# Patient Record
Sex: Male | Born: 1985 | Race: White | Hispanic: No | Marital: Married | Smoking: Current every day smoker
Health system: Southern US, Community
[De-identification: ages and names within clinical notes are randomized; demographics above are authoritative.]

## PROBLEM LIST (undated history)

## (undated) DIAGNOSIS — F0781 Postconcussional syndrome: Principal | ICD-10-CM

## (undated) DIAGNOSIS — G43009 Migraine without aura, not intractable, without status migrainosus: Secondary | ICD-10-CM

## (undated) DIAGNOSIS — R002 Palpitations: Secondary | ICD-10-CM

## (undated) DIAGNOSIS — R42 Dizziness and giddiness: Secondary | ICD-10-CM

## (undated) DIAGNOSIS — I10 Essential (primary) hypertension: Secondary | ICD-10-CM

## (undated) HISTORY — DX: Postconcussional syndrome: F07.81

## (undated) HISTORY — DX: Dizziness and giddiness: R42

## (undated) HISTORY — PX: HERNIA REPAIR: SHX51

## (undated) HISTORY — PX: ORTHOPEDIC SURGERY: SHX850

## (undated) HISTORY — PX: BACK SURGERY: SHX140

## (undated) HISTORY — DX: Migraine without aura, not intractable, without status migrainosus: G43.009

## (undated) HISTORY — DX: Palpitations: R00.2

---
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
## 2005-01-17 ENCOUNTER — Emergency Department (HOSPITAL_COMMUNITY): Admission: EM | Admit: 2005-01-17 | Discharge: 2005-01-17 | Payer: Self-pay | Admitting: Emergency Medicine

## 2006-01-28 ENCOUNTER — Emergency Department (HOSPITAL_COMMUNITY): Admission: EM | Admit: 2006-01-28 | Discharge: 2006-01-28 | Payer: Self-pay | Admitting: Emergency Medicine

## 2006-09-03 ENCOUNTER — Emergency Department (HOSPITAL_COMMUNITY): Admission: EM | Admit: 2006-09-03 | Discharge: 2006-09-03 | Payer: Self-pay | Admitting: Emergency Medicine

## 2006-09-06 ENCOUNTER — Ambulatory Visit (HOSPITAL_COMMUNITY): Admission: RE | Admit: 2006-09-06 | Discharge: 2006-09-06 | Payer: Self-pay | Admitting: Family Medicine

## 2006-10-26 ENCOUNTER — Ambulatory Visit (HOSPITAL_COMMUNITY): Admission: RE | Admit: 2006-10-26 | Discharge: 2006-10-27 | Payer: Self-pay | Admitting: Neurosurgery

## 2006-11-23 ENCOUNTER — Ambulatory Visit (HOSPITAL_COMMUNITY): Admission: RE | Admit: 2006-11-23 | Discharge: 2006-11-23 | Payer: Self-pay | Admitting: Family Medicine

## 2007-02-16 ENCOUNTER — Ambulatory Visit (HOSPITAL_COMMUNITY): Admission: RE | Admit: 2007-02-16 | Discharge: 2007-02-16 | Payer: Self-pay | Admitting: Neurosurgery

## 2007-03-26 ENCOUNTER — Encounter (HOSPITAL_COMMUNITY): Admission: RE | Admit: 2007-03-26 | Discharge: 2007-04-25 | Payer: Self-pay | Admitting: Neurosurgery

## 2007-04-26 ENCOUNTER — Encounter (HOSPITAL_COMMUNITY): Admission: RE | Admit: 2007-04-26 | Discharge: 2007-05-26 | Payer: Self-pay | Admitting: Neurosurgery

## 2007-09-17 ENCOUNTER — Encounter
Admission: RE | Admit: 2007-09-17 | Discharge: 2007-09-18 | Payer: Self-pay | Admitting: Physical Medicine & Rehabilitation

## 2007-09-17 ENCOUNTER — Ambulatory Visit: Payer: Self-pay | Admitting: Physical Medicine & Rehabilitation

## 2007-11-21 ENCOUNTER — Ambulatory Visit: Payer: Self-pay | Admitting: Physical Medicine & Rehabilitation

## 2007-11-29 ENCOUNTER — Emergency Department (HOSPITAL_COMMUNITY): Admission: EM | Admit: 2007-11-29 | Discharge: 2007-11-29 | Payer: Self-pay | Admitting: Emergency Medicine

## 2008-10-13 ENCOUNTER — Emergency Department (HOSPITAL_COMMUNITY): Admission: EM | Admit: 2008-10-13 | Discharge: 2008-10-14 | Payer: Self-pay | Admitting: Emergency Medicine

## 2009-03-11 ENCOUNTER — Emergency Department (HOSPITAL_COMMUNITY): Admission: EM | Admit: 2009-03-11 | Discharge: 2009-03-11 | Payer: Self-pay | Admitting: Emergency Medicine

## 2010-04-16 ENCOUNTER — Ambulatory Visit (HOSPITAL_COMMUNITY): Admission: RE | Admit: 2010-04-16 | Discharge: 2010-04-16 | Payer: Self-pay | Admitting: General Surgery

## 2010-11-28 ENCOUNTER — Encounter: Payer: Self-pay | Admitting: Neurosurgery

## 2011-01-24 LAB — BASIC METABOLIC PANEL
CO2: 27 mEq/L (ref 19–32)
Calcium: 9.6 mg/dL (ref 8.4–10.5)
GFR calc Af Amer: >60 mL/min (ref >60)
GFR calc non Af Amer: >60 mL/min (ref >60)
Sodium: 139 mEq/L (ref 135–145)

## 2011-01-24 LAB — SURGICAL PCR SCREEN: MRSA, PCR: NEGATIVE

## 2011-01-24 LAB — CBC
Hemoglobin: 13.7 g/dL (ref 13.0–17.0)
RBC: 4.3 MIL/uL (ref 4.22–5.81)
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
## 2011-03-25 NOTE — Op Note (Signed)
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   PROCEDURE:  Bilateral L4-5 microdiskectomy using microdissection.   SURGEON:  Cristi Loron, M.D.   ASSISTANT:  Danae Orleans. Venetia Maxon, M.D.   ANESTHESIA:  General endotracheal.   ESTIMATED BLOOD LOSS:  50 cc.   SPECIMENS:  None.   DRAINS:  None.   COMPLICATIONS:  None.   BRIEF HISTORY:  The patient is a 25 year old white male, who suffers  from back and bilateral leg pain.  He has failed medical management and  was worked up with a lumbar MRI, which demonstrated a large herniated  disk at L4-5 bilaterally.  I discussed the various treatment options  with the patient, including surgery.  The patient weighed the risks,  benefits and alternatives of surgery and decided to proceed with an L4-5  microdiskectomy.   DESCRIPTION OF PROCEDURE:  The patient was brought to the operating room  by the anesthesia team.  General endotracheal anesthesia was induced.  The patient was then turned to the prone position on the Wilson frame.  His lumbosacral region was then prepared with Benadryl Scrub with  Betadine Solution and sterile drapes were applied.  I then injected the  area to be incised with Marcaine with epinephrine solution and used a  scalpel to make a linear midline incision over the L4-5 interspace.  I  used electrocautery to perform a  bilateral subperiosteal dissection,  exposing the bilateral spinous processes and laminae of L4 and L5.  We  then obtained intraoperative radiograph to confirm our location.  We  then inserted a McCullough retractor for exposure, and then we brought  the operating microscope into the field, and under its magnification and  illumination, we completed the microdissection/decompression.  I used a  high-speed drill to perform bilateral L4 laminotomies.  We then widened  the laminotomies with a Kerrison punch and removed the bilateral L4-5  ligamentum flavum.  We then used microdissection to free up the thecal  sac and the nerve roots from the epidural tissue.  We encountered, as  suspected, a large herniated disk at L4-5.  We began the decompression  on the right side.  We incised the L4-5 intervertebral disk/disk  herniation on the right.  We performed a partial intervertebral  diskectomy using a pituitary forceps and Epstein curets.  We then  repeated this procedure on the left side, and at this point we had a  good bilateral decompression.  We palpated along the ventral surface of  the thecal sac and along the external route of the bilateral L5 nerve  roots,  and noted that the neural structures were well decompressed.  We  obtained hemostasis using bipolar electrocautery.  We irrigated the  wound out with Bacitracin solution.  We removed the McCullough retractor  and then reapproximated the patient's thoracolumbar fascia with  interrupted #1 Vicryl suture, the subcutaneous tissue with interrupted 2-  0 Vicryl suture, and the skin with Steri-Strips and benzoin.  The wound  was then coated with Bacitracin ointment and a sterile dressing was  applied.  The drapes were removed and the patient was subsequently  returned to the supine position, where he was extubated by the  anesthesia team and transported to the postanesthesia care unit in  stable condition.  All sponge, instrument and needle  counts were correct  at the end of this case.      Cristi Loron, M.D.  Electronically Signed     JDJ/MEDQ  D:  10/26/2006  T:  10/27/2006  Job:  [id number]

## 2011-06-09 ENCOUNTER — Emergency Department (HOSPITAL_COMMUNITY)
Admission: EM | Admit: 2011-06-09 | Discharge: 2011-06-09 | Disposition: A | Discharge status: Home / Self Care | Payer: Self-pay | Attending: Emergency Medicine | Admitting: Emergency Medicine

## 2011-06-09 DIAGNOSIS — M549 Dorsalgia, unspecified: Secondary | ICD-10-CM | POA: Insufficient documentation

## 2011-06-09 DIAGNOSIS — I10 Essential (primary) hypertension: Secondary | ICD-10-CM | POA: Insufficient documentation

## 2011-06-09 DIAGNOSIS — R209 Unspecified disturbances of skin sensation: Secondary | ICD-10-CM | POA: Insufficient documentation

## 2011-06-09 DIAGNOSIS — G8929 Other chronic pain: Secondary | ICD-10-CM | POA: Insufficient documentation

## 2011-06-09 DIAGNOSIS — Z760 Encounter for issue of repeat prescription: Secondary | ICD-10-CM | POA: Insufficient documentation

## 2011-06-09 MED ORDER — CLONIDINE HCL 0.2 MG PO TABS: 0.1000 mg | ORAL_TABLET | Freq: Two times a day (BID) | ORAL | Status: DC

## 2011-06-09 MED ORDER — CLONIDINE HCL 0.1 MG PO TABS
0.1000 mg | ORAL_TABLET | Freq: Once | ORAL | Status: AC
  Administered 2011-06-09: 0.1 mg via ORAL
  Filled 2011-06-09: qty 1

## 2011-06-09 MED ORDER — HYDROCODONE-ACETAMINOPHEN 5-325 MG PO TABS: 1.0000 | ORAL_TABLET | ORAL | Status: AC | PRN

## 2011-06-09 MED ORDER — GABAPENTIN 300 MG PO CAPS: 300.0000 mg | ORAL_CAPSULE | Freq: Three times a day (TID) | ORAL | Status: DC
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
## 2011-06-11 NOTE — ED Provider Notes (Signed)
Medical screening examination/treatment/procedure(s) were performed by non-physician practitioner and as supervising physician I was immediately available for consultation/collaboration.  Joya Gaskins, MD 06/11/11 [phone]

## 2011-07-08 ENCOUNTER — Emergency Department (HOSPITAL_COMMUNITY): Payer: Worker's Compensation

## 2011-07-08 ENCOUNTER — Emergency Department (HOSPITAL_COMMUNITY)
Admission: EM | Admit: 2011-07-08 | Discharge: 2011-07-08 | Disposition: A | Discharge status: Home / Self Care | Payer: Worker's Compensation | Attending: Emergency Medicine | Admitting: Emergency Medicine

## 2011-07-08 ENCOUNTER — Encounter: Payer: Self-pay | Admitting: *Deleted

## 2011-07-08 DIAGNOSIS — F17200 Nicotine dependence, unspecified, uncomplicated: Secondary | ICD-10-CM | POA: Insufficient documentation

## 2011-07-08 DIAGNOSIS — I10 Essential (primary) hypertension: Secondary | ICD-10-CM | POA: Insufficient documentation

## 2011-07-08 DIAGNOSIS — W269XXA Contact with unspecified sharp object(s), initial encounter: Secondary | ICD-10-CM | POA: Insufficient documentation

## 2011-07-08 DIAGNOSIS — S61209A Unspecified open wound of unspecified finger without damage to nail, initial encounter: Secondary | ICD-10-CM | POA: Insufficient documentation

## 2011-07-08 DIAGNOSIS — S61219A Laceration without foreign body of unspecified finger without damage to nail, initial encounter: Secondary | ICD-10-CM

## 2011-07-08 DIAGNOSIS — Y9269 Other specified industrial and construction area as the place of occurrence of the external cause: Secondary | ICD-10-CM | POA: Insufficient documentation

## 2011-07-08 HISTORY — DX: Essential (primary) hypertension: I10

## 2011-07-08 MED ORDER — CEPHALEXIN 500 MG PO CAPS
500.0000 mg | ORAL_CAPSULE | Freq: Once | ORAL | Status: AC
  Administered 2011-07-08: 500 mg via ORAL
  Filled 2011-07-08: qty 1

## 2011-07-08 MED ORDER — CEPHALEXIN 500 MG PO CAPS: 500.0000 mg | ORAL_CAPSULE | Freq: Four times a day (QID) | ORAL | Status: AC

## 2011-07-08 MED ORDER — LIDOCAINE HCL (PF) 1 % IJ SOLN
INTRAMUSCULAR | Status: AC
  Administered 2011-07-08: 22:00:00
  Filled 2011-07-08: qty 5

## 2011-07-08 NOTE — ED Provider Notes (Signed)
History     CSN: [id number] Arrival date & time: 07/08/2011  8:04 PM  Chief Complaint  Patient presents with  . Extremity Laceration   HPI  Past Medical History  Diagnosis Date  . Hypertension     Past Surgical History  Procedure Date  . Back surgery   . Hernia repair   . Orthopedic surgery     History reviewed. No pertinent family history.  History  Substance Use Topics  . Smoking status: Current Everyday Smoker -- 0.5 packs/day  . Smokeless tobacco: Not on file  . Alcohol Use: No      Review of Systems  Physical Exam  BP 116/76  Pulse 91  Temp 99 F (37.2 C)  Resp 20  SpO2 100%  Physical Exam  Musculoskeletal:       Left hand: He exhibits tenderness, decreased capillary refill and laceration. He exhibits normal range of motion, no bony tenderness, normal two-point discrimination, no deformity and no swelling. normal sensation noted. Normal strength noted.       Hands:   ED Course  Procedures  MDM       Worthy Rancher, [place] 07/08/11 2201

## 2011-07-08 NOTE — ED Notes (Signed)
Lac to right middle finger

## 2011-07-08 NOTE — ED Notes (Signed)
laceartion to left middle finger, cut at work

## 2011-07-09 NOTE — ED Provider Notes (Signed)
Medical screening examination/treatment/procedure(s) were performed by non-physician practitioner and as supervising physician I was immediately available for consultation/collaboration.  Glynn Octave, MD 07/09/11 [phone]

## 2012-08-02 ENCOUNTER — Emergency Department (HOSPITAL_COMMUNITY)
Admission: EM | Admit: 2012-08-02 | Discharge: 2012-08-03 | Disposition: A | Discharge status: Home / Self Care | Payer: Worker's Compensation | Attending: Emergency Medicine | Admitting: Emergency Medicine

## 2012-08-02 ENCOUNTER — Emergency Department (HOSPITAL_COMMUNITY): Payer: No Typology Code available for payment source

## 2012-08-02 ENCOUNTER — Encounter (HOSPITAL_COMMUNITY): Payer: Self-pay

## 2012-08-02 DIAGNOSIS — F17200 Nicotine dependence, unspecified, uncomplicated: Secondary | ICD-10-CM | POA: Insufficient documentation

## 2012-08-02 DIAGNOSIS — S39012A Strain of muscle, fascia and tendon of lower back, initial encounter: Secondary | ICD-10-CM

## 2012-08-02 DIAGNOSIS — S335XXA Sprain of ligaments of lumbar spine, initial encounter: Secondary | ICD-10-CM | POA: Insufficient documentation

## 2012-08-02 DIAGNOSIS — S139XXA Sprain of joints and ligaments of unspecified parts of neck, initial encounter: Secondary | ICD-10-CM | POA: Insufficient documentation

## 2012-08-02 DIAGNOSIS — I10 Essential (primary) hypertension: Secondary | ICD-10-CM | POA: Insufficient documentation

## 2012-08-02 DIAGNOSIS — S161XXA Strain of muscle, fascia and tendon at neck level, initial encounter: Secondary | ICD-10-CM
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
## 2012-08-03 MED ORDER — METHOCARBAMOL 500 MG PO TABS
1000.0000 mg | ORAL_TABLET | Freq: Once | ORAL | Status: AC
  Administered 2012-08-03: 1000 mg via ORAL
  Filled 2012-08-03: qty 2

## 2012-08-03 MED ORDER — METHOCARBAMOL 500 MG PO TABS: ORAL_TABLET | ORAL | Status: DC

## 2012-08-03 MED ORDER — HYDROCODONE-ACETAMINOPHEN 5-325 MG PO TABS
2.0000 | ORAL_TABLET | Freq: Once | ORAL | Status: AC
  Administered 2012-08-03: 2 via ORAL
  Filled 2012-08-03: qty 2

## 2012-08-03 MED ORDER — HYDROCODONE-ACETAMINOPHEN 5-325 MG PO TABS: 1.0000 | ORAL_TABLET | ORAL | Status: DC | PRN

## 2012-08-03 NOTE — ED Notes (Signed)
Pt alert & oriented x4, stable gait. Patient given discharge instructions, paperwork & prescription(s). Patient  instructed to stop at the registration desk to finish any additional paperwork. Patient verbalized understanding. Pt left department w/ no further questions. 

## 2012-08-07 NOTE — ED Provider Notes (Signed)
Medical screening examination/treatment/procedure(s) were performed by non-physician practitioner and as supervising physician I was immediately available for consultation/collaboration.   Joya Gaskins, MD 08/07/12 [phone]

## 2015-09-03 ENCOUNTER — Encounter: Payer: Self-pay | Admitting: Neurology

## 2015-09-03 ENCOUNTER — Ambulatory Visit (INDEPENDENT_AMBULATORY_CARE_PROVIDER_SITE_OTHER): Payer: Self-pay | Admitting: Neurology

## 2015-09-03 VITALS — BP 132/83 | HR 85 | Ht 74.0 in | Wt 199.0 lb

## 2015-09-03 DIAGNOSIS — G43009 Migraine without aura, not intractable, without status migrainosus: Secondary | ICD-10-CM

## 2015-09-03 DIAGNOSIS — R42 Dizziness and giddiness: Secondary | ICD-10-CM | POA: Insufficient documentation

## 2015-09-03 DIAGNOSIS — F0781 Postconcussional syndrome: Secondary | ICD-10-CM

## 2015-09-03 HISTORY — DX: Migraine without aura, not intractable, without status migrainosus: G43.009

## 2015-09-03 HISTORY — DX: Postconcussional syndrome: F07.81

## 2015-09-03 HISTORY — DX: Dizziness and giddiness: R42

## 2015-09-03 MED ORDER — ONDANSETRON 4 MG PO TBDP: 4.0000 mg | ORAL_TABLET | Freq: Four times a day (QID) | ORAL | Status: AC | PRN

## 2015-09-03 MED ORDER — MECLIZINE HCL 25 MG PO TABS: 25.0000 mg | ORAL_TABLET | Freq: Three times a day (TID) | ORAL | Status: AC | PRN

## 2015-09-03 MED ORDER — TOPIRAMATE 25 MG PO TABS: ORAL_TABLET | ORAL | Status: DC
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
## 2015-09-03 NOTE — Patient Instructions (Addendum)
We will start Topamax for the headache. No physical or cognitive activity until seen next.  Topamax (topiramate) is a seizure medication that has an FDA approval for seizures and for migraine headache. Potential side effects of this medication include weight loss, cognitive slowing, tingling in the fingers and toes, and carbonated drinks will taste bad. If any significant side effects are noted on this drug, please contact our office.    Post-Concussion Syndrome Post-concussion syndrome describes the symptoms that can occur after a head injury. These symptoms can last from weeks to months. CAUSES  It is not clear why some head injuries cause post-concussion syndrome. It can occur whether your head injury was mild or severe and whether you were wearing head protection or not.  SIGNS AND SYMPTOMS  Memory difficulties.  Dizziness.  Headaches.  Double vision or blurry vision.  Sensitivity to light.  Hearing difficulties.  Depression.  Tiredness.  Weakness.  Difficulty with concentration.  Difficulty sleeping or staying asleep.  Vomiting.  Poor balance or instability on your feet.  Slow reaction time.  Difficulty learning and remembering things you have heard. DIAGNOSIS  There is no test to determine whether you have post-concussion syndrome. Your health care provider may order an imaging scan of your brain, such as a CT scan, to check for other problems that may be causing your symptoms (such as a severe injury inside your skull). TREATMENT  Usually, these problems disappear over time without medical care. Your health care provider may prescribe medicine to help ease your symptoms. It is important to follow up with a neurologist to evaluate your recovery and address any lingering symptoms or issues. HOME CARE INSTRUCTIONS   Take medicines only as directed by your health care provider. Do not take aspirin. Aspirin can slow blood clotting.  Sleep with your head slightly  elevated to help with headaches.  Avoid any situation where there is potential for another head injury. This includes football, hockey, soccer, basketball, martial arts, downhill snow sports, and horseback riding. Your condition will get worse every time you experience a concussion. You should avoid these activities until you are evaluated by the appropriate follow-up health care providers.  Keep all follow-up visits as directed by your health care provider. This is important. SEEK MEDICAL CARE IF:  You have increased problems paying attention or concentrating.  You have increased difficulty remembering or learning new information.  You need more time to complete tasks or assignments than before.  You have increased irritability or decreased ability to cope with stress.  You have more symptoms than before. Seek medical care if you have any of the following symptoms for more than two weeks after your injury:  Lasting (chronic) headaches.  Dizziness or balance problems.  Nausea.  Vision problems.  Increased sensitivity to noise or light.  Depression or mood swings.  Anxiety or irritability.  Memory problems.  Difficulty concentrating or paying attention.  Sleep problems.  Feeling tired all the time. SEEK IMMEDIATE MEDICAL CARE IF:  You have confusion or unusual drowsiness.  Others find it difficult to wake you up.  You have nausea or persistent, forceful vomiting.  You feel like you are moving when you are not (vertigo). Your eyes may move rapidly back and forth.  You have convulsions or faint.  You have severe, persistent headaches that are not relieved by medicine.  You cannot use your arms or legs normally.  One of your pupils is larger than the other.  You have clear or  bloody discharge from your nose or ears.  Your problems are getting worse, not better. MAKE SURE YOU:  Understand these instructions.  Will watch your condition.  Will get help right  away if you are not doing well or get worse.   This information is not intended to replace advice given to you by your health care provider. Make sure you discuss any questions you have with your health care provider.   Document Released: 04/15/2002 Document Revised: 11/14/2014 Document Reviewed: 01/29/2014 Elsevier Interactive Patient Education [employer]2016 Elsevier Inc.

## 2015-10-05 ENCOUNTER — Ambulatory Visit: Payer: Self-pay | Admitting: Adult Health

## 2015-10-08 ENCOUNTER — Telehealth: Payer: Self-pay | Admitting: Neurology

## 2015-10-08 NOTE — Telephone Encounter (Signed)
Darcus AustinJill Wilkes RN [facility] [place] [facility] called sts pt will be seeing a neurologist that accepts W/C. She appreciated Dr Anne HahnWillis seeing pt and getting medical care started.

## 2016-01-18 ENCOUNTER — Other Ambulatory Visit: Payer: Self-pay | Admitting: Neurology

## 2016-07-06 ENCOUNTER — Ambulatory Visit: Payer: Self-pay | Admitting: Family Medicine

## 2017-09-08 DIAGNOSIS — Z6831 Body mass index (BMI) 31.0-31.9, adult: Secondary | ICD-10-CM | POA: Diagnosis not present

## 2017-09-08 DIAGNOSIS — M542 Cervicalgia: Secondary | ICD-10-CM | POA: Diagnosis not present

## 2017-09-08 DIAGNOSIS — R51 Headache: Secondary | ICD-10-CM | POA: Diagnosis not present

## 2017-09-08 DIAGNOSIS — R202 Paresthesia of skin: Secondary | ICD-10-CM | POA: Diagnosis not present

## 2017-09-18 DIAGNOSIS — Z0000 Encounter for general adult medical examination without abnormal findings: Secondary | ICD-10-CM | POA: Diagnosis not present

## 2017-09-20 DIAGNOSIS — Z0000 Encounter for general adult medical examination without abnormal findings: Secondary | ICD-10-CM | POA: Diagnosis not present

## 2017-09-26 ENCOUNTER — Other Ambulatory Visit (HOSPITAL_COMMUNITY): Payer: Self-pay | Admitting: Internal Medicine

## 2017-09-26 DIAGNOSIS — M542 Cervicalgia: Secondary | ICD-10-CM

## 2017-09-26 DIAGNOSIS — G44221 Chronic tension-type headache, intractable: Secondary | ICD-10-CM

## 2017-09-26 DIAGNOSIS — R202 Paresthesia of skin: Secondary | ICD-10-CM

## 2017-10-03 ENCOUNTER — Ambulatory Visit (HOSPITAL_COMMUNITY)
Admission: RE | Admit: 2017-10-03 | Discharge: 2017-10-03 | Disposition: A | Discharge status: Home / Self Care | Payer: Worker's Compensation | Source: Ambulatory Visit | Attending: Internal Medicine | Admitting: Internal Medicine

## 2017-10-03 ENCOUNTER — Ambulatory Visit (HOSPITAL_COMMUNITY)
Admission: RE | Admit: 2017-10-03 | Discharge: 2017-10-03 | Disposition: A | Discharge status: Home / Self Care | Payer: Self-pay | Source: Ambulatory Visit | Attending: Internal Medicine | Admitting: Internal Medicine

## 2017-10-03 DIAGNOSIS — R202 Paresthesia of skin: Secondary | ICD-10-CM

## 2017-10-03 DIAGNOSIS — M542 Cervicalgia: Secondary | ICD-10-CM

## 2017-10-03 DIAGNOSIS — G44221 Chronic tension-type headache, intractable: Secondary | ICD-10-CM

## 2017-10-03 MED ORDER — GADOBENATE DIMEGLUMINE 529 MG/ML IV SOLN
20.0000 mL | Freq: Once | INTRAVENOUS | Status: AC | PRN
  Administered 2017-10-03: 20 mL via INTRAVENOUS

## 2018-03-27 DIAGNOSIS — F1120 Opioid dependence, uncomplicated: Secondary | ICD-10-CM | POA: Diagnosis not present

## 2018-03-27 DIAGNOSIS — Z7151 Drug abuse counseling and surveillance of drug abuser: Secondary | ICD-10-CM | POA: Diagnosis not present

## 2021-02-05 ENCOUNTER — Other Ambulatory Visit: Payer: Self-pay

## 2021-02-05 NOTE — Patient Outreach (Signed)
Care Coordination  02/05/2021  EBERARDO DEMELLO Jan 18, 1986 [id number]   Medicaid Managed Care   Unsuccessful Outreach Note  02/05/2021 Name: APOLLOS TENBRINK MRN: [id number] DOB: [birth year]  Referred by: Babs Sciara, MD Reason for referral : High Risk Managed Medicaid (MM Screen Unsuccessful Telephone Outreach)   An unsuccessful telephone outreach was attempted today. The patient was referred to the case management team for assistance with care management and care coordination.   Follow Up Plan: The care management team will reach out to the patient again over the next 7 days.   Gus Puma, BSW, [place] [facility]  [place]  High Risk Managed Medicaid Team

## 2021-02-05 NOTE — Patient Instructions (Signed)
Visit Information  Mr. Geoffrey Carson  - as a part of your Medicaid benefit, you are eligible for care management and care coordination services at no cost or copay. I was unable to reach you by phone today but would be happy to help you with your health related needs. Please feel free to call me @ [phone].   A member of the Managed Medicaid care management team will reach out to you again over the next 7 days.   Raesha Coonrod, BSW, [facility]    High Risk Managed Medicaid Team    

## 2021-02-16 ENCOUNTER — Telehealth: Payer: Self-pay

## 2021-02-16 NOTE — Patient Outreach (Signed)
Care Coordination  02/16/2021  Geoffrey Carson Sep 03, 1986 [id number]   Medicaid Managed Care   Unsuccessful Outreach Note  02/16/2021 Name: Geoffrey Carson MRN: [id number] DOB: 1985/12/13  Referred by: Babs Sciara, MD Reason for referral : High Risk Managed Medicaid (MM Screen Unsuccessful Telephone Outreach)   A second unsuccessful telephone outreach was attempted today. The patient was referred to the case management team for assistance with care management and care coordination.   Follow Up Plan: The care management team will reach out to the patient again over the next 7 days.   Gus Puma, BSW, [place] [facility]  [place]  High Risk Managed Medicaid Team

## 2021-02-16 NOTE — Patient Instructions (Signed)
Visit Information  Mr. Geoffrey Carson  - as a part of your Medicaid benefit, you are eligible for care management and care coordination services at no cost or copay. I was unable to reach you by phone today but would be happy to help you with your health related needs. Please feel free to call me @ [phone].   A member of the Managed Medicaid care management team will reach out to you again over the next 7 days.   Gus Puma, BSW, [place] [facility]  [place]  High Risk Managed Medicaid Team

## 2021-09-04 ENCOUNTER — Emergency Department (HOSPITAL_COMMUNITY)
Admission: EM | Admit: 2021-09-04 | Discharge: 2021-09-04 | Disposition: A | Discharge status: Home / Self Care | Payer: Medicaid Other | Attending: Emergency Medicine | Admitting: Emergency Medicine

## 2021-09-04 ENCOUNTER — Encounter: Payer: Self-pay | Admitting: Emergency Medicine

## 2021-09-04 ENCOUNTER — Encounter (HOSPITAL_COMMUNITY): Payer: Self-pay | Admitting: *Deleted

## 2021-09-04 ENCOUNTER — Other Ambulatory Visit: Payer: Self-pay

## 2021-09-04 ENCOUNTER — Ambulatory Visit
Admission: EM | Admit: 2021-09-04 | Discharge: 2021-09-04 | Disposition: A | Discharge status: Home / Self Care | Payer: Medicaid Other

## 2021-09-04 DIAGNOSIS — Z87891 Personal history of nicotine dependence: Secondary | ICD-10-CM | POA: Insufficient documentation

## 2021-09-04 DIAGNOSIS — Z760 Encounter for issue of repeat prescription: Secondary | ICD-10-CM | POA: Diagnosis not present

## 2021-09-04 DIAGNOSIS — I10 Essential (primary) hypertension: Secondary | ICD-10-CM | POA: Insufficient documentation

## 2021-09-04 DIAGNOSIS — R531 Weakness: Secondary | ICD-10-CM | POA: Insufficient documentation

## 2021-09-04 MED ORDER — BUPRENORPHINE HCL-NALOXONE HCL 8-2 MG SL FILM: 1.0000 | ORAL_FILM | Freq: Three times a day (TID) | SUBLINGUAL | 0 refills | Status: DC

## 2021-09-04 MED ORDER — CLONAZEPAM 0.5 MG PO TABS: ORAL_TABLET | ORAL | 0 refills | Status: DC

## 2021-09-04 MED ORDER — TRAMADOL HCL 50 MG PO TABS: 100.0000 mg | ORAL_TABLET | Freq: Four times a day (QID) | ORAL | 0 refills | Status: DC | PRN

## 2021-09-04 NOTE — ED Provider Notes (Signed)
[facility] EMERGENCY DEPARTMENT Provider Note   CSN: [id number] Arrival date & time: 09/04/21  1321     History Chief Complaint  Patient presents with   Medication Refill    Geoffrey Carson is a 35 y.o. male.  Patient with a history of chronic pain.  He has been on Ultram naloxone and clonazepam.  He is unable to get in touch with his doctor to get a refill.  He has been off all his medicines for 3 days  The history is provided by the patient and medical records. No language interpreter was used.  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/03/2015    Patient Active Problem List   Diagnosis Date Noted   Post concussion syndrome 09/03/2015   Post-concussion vertigo 09/03/2015   Common migraine 09/03/2015    Past Surgical History:  Procedure Laterality Date   BACK SURGERY     HERNIA REPAIR     ORTHOPEDIC SURGERY         Family History  Problem Relation Age of Onset   Arthritis Father    Hypertension Father    Migraines Father    Diabetes Father    Diabetes Maternal Grandmother    Diabetes Maternal Grandfather    Diabetes Paternal Grandmother    Diabetes Paternal Grandfather    Cancer Mother        breast   Diabetes Mother    Celiac disease Sister     Social History   Tobacco Use   Smoking status: Former    Packs/day: 0.50    Types: Cigarettes    Quit date: 08/07/2014    Years since quitting: 7.0   Smokeless tobacco: Never  Substance Use Topics   Alcohol use: No   Drug use: No    Home Medications Prior to  Admission medications   Medication Sig Start Date End Date Taking? Authorizing Provider  Buprenorphine HCl-Naloxone HCl 8-2 MG FILM Place 1 Film (1 each total) inside cheek 3 (three) times daily. One tid 09/04/21  Yes Bethann Berkshire, MD  clonazePAM Scarlette Calico) 0.5 MG tablet One tablet qid 09/04/21  Yes Bethann Berkshire, MD  traMADol (ULTRAM) 50 MG tablet Take 2 tablets (100 mg total) by mouth every 6 (six) hours as needed for moderate pain. 09/04/21  Yes Bethann Berkshire, MD  ibuprofen (ADVIL,MOTRIN) 200 MG tablet Take 800 mg by mouth 2 (two) times daily as needed. For pain     [provider]  meclizine (ANTIVERT) 25 MG tablet Take 1 tablet (25 mg total) by mouth 3 (three) times daily as needed for dizziness. 09/03/15   York Spaniel, MD  ondansetron (ZOFRAN-ODT) 4 MG disintegrating tablet Take 1 tablet (4 mg total) by mouth every 6 (six) hours as needed for nausea or vomiting. 09/03/15   York Spaniel, MD  sertraline (ZOLOFT) 50 MG tablet Take 50 mg by mouth daily.    [provider]  topiramate (TOPAMAX) 25 MG tablet Take one tablet at night for one week, then take 2 tablets at night for one week, then take 3  tablets at night. 09/03/15   York Spaniel, MD    Allergies    Azithromycin, Erythromycin, and Escitalopram  Review of Systems   Review of Systems  Constitutional:  Negative for appetite change and fatigue.  HENT:  Negative for congestion, ear discharge and sinus pressure.   Eyes:  Negative for discharge.  Respiratory:  Negative for cough.   Cardiovascular:  Negative for chest pain.  Gastrointestinal:  Negative for abdominal pain and diarrhea.  Genitourinary:  Negative for frequency and hematuria.  Musculoskeletal:  Negative for back pain.  Skin:  Negative for rash.  Neurological:  Positive for weakness. Negative for seizures and headaches.  Psychiatric/Behavioral:  Negative for hallucinations.    Physical Exam Updated Vital Signs BP (!) 128/91 (BP  Location: Right Arm)   Pulse 82   Temp 97.9 F (36.6 C) (Oral)   Resp 17   SpO2 100%   Physical Exam Vitals and nursing note reviewed.  Constitutional:      Appearance: He is well-developed.  HENT:     Head: Normocephalic.     Nose: Nose normal.  Eyes:     General: No scleral icterus.    Conjunctiva/sclera: Conjunctivae normal.  Neck:     Thyroid: No thyromegaly.  Cardiovascular:     Rate and Rhythm: Normal rate and regular rhythm.     Heart sounds: No murmur heard.   No friction rub. No gallop.  Pulmonary:     Breath sounds: No stridor. No wheezing or rales.  Chest:     Chest wall: No tenderness.  Abdominal:     General: There is no distension.     Tenderness: There is no abdominal tenderness. There is no rebound.  Musculoskeletal:        General: Normal range of motion.     Cervical back: Neck supple.  Lymphadenopathy:     Cervical: No cervical adenopathy.  Skin:    Findings: No erythema or rash.  Neurological:     Mental Status: He is alert and oriented to person, place, and time.     Motor: No abnormal muscle tone.     Coordination: Coordination normal.  Psychiatric:        Behavior: Behavior normal.    ED Results / Procedures / Treatments   Labs (all labs ordered are listed, but only abnormal results are displayed) Labs Reviewed - No data to display  EKG None  Radiology No results found.  Procedures Procedures   Medications Ordered in ED Medications - No data to display  ED Course  I have reviewed the triage vital signs and the nursing notes.  Pertinent labs & imaging results that were available during my care of the patient were reviewed by me and considered in my medical decision making (see chart for details).    MDM Rules/Calculators/A&P                           Patient for medication refill of his tramadol clonazepam and Suboxone.  He was given 5-day supply and will follow up with his primary care or his pain management doctor Final  Clinical Impression(s) / ED Diagnoses Final diagnoses:  Medication refill    Rx / DC Orders ED Discharge Orders          Ordered    traMADol (ULTRAM) 50 MG tablet  Every 6 hours PRN        09/04/21 1529    clonazePAM (KLONOPIN) 0.5 MG  tablet        09/04/21 1529    Buprenorphine HCl-Naloxone HCl 8-2 MG FILM  3 times daily       Note to Pharmacy: NADEAN:  pt needs to take one pill tid   09/04/21 1529             Bethann Berkshire, MD 09/04/21 1533

## 2021-09-04 NOTE — ED Triage Notes (Signed)
Referred from urgent care for medication refill

## 2021-09-04 NOTE — ED Provider Notes (Signed)
Patient evaluated through triage.  Unfortunately I am not able to refill his medications.  Recommended that he present through the emergency room.  No visit was done.   Wallis Bamberg, PA-C 09/04/21 1302

## 2021-09-04 NOTE — ED Triage Notes (Signed)
Needs a medication refill on   clonazepam 0.5 mg 4 times a day, buprenorphin-naloxon 8mg  1 every 8 hours, tramadol 50 mg 2 tabs 4 times a day.  States he has not been able to get in touch with his doctor to get his  meds refilled.

## 2021-09-04 NOTE — Discharge Instructions (Signed)
Follow-up with your pain management doctor or Dr. Gerda Diss this week

## 2021-09-06 ENCOUNTER — Telehealth: Payer: Self-pay

## 2021-09-06 NOTE — Telephone Encounter (Signed)
Transition Care Management Follow-up Telephone Call Date of discharge and from where: 09/04/2021-[place]  How have you been since you were released from the hospital? Patient stated he is doing ok. Any questions or concerns? No  Items Reviewed: Did the pt receive and understand the discharge instructions provided? Yes  Medications obtained and verified? Yes  Other? No  Any new allergies since your discharge? No  Dietary orders reviewed? No Do you have support at home? Yes   Home Care and Equipment/Supplies: Were home health services ordered? not applicable If so, what is the name of the agency? N/A  Has the agency set up a time to come to the patient's home? not applicable Were any new equipment or medical supplies ordered?  No What is the name of the medical supply agency? N/A Were you able to get the supplies/equipment? not applicable Do you have any questions related to the use of the equipment or supplies? No  Functional Questionnaire: (I = Independent and D = Dependent) ADLs: I  Bathing/Dressing- I  Meal Prep- I  Eating- I  Maintaining continence- I  Transferring/Ambulation- I  Managing Meds- I  Follow up appointments reviewed:  PCP Hospital f/u appt confirmed? No  Specialist Hospital f/u appt confirmed? No   Are transportation arrangements needed? No  If their condition worsens, is the pt aware to call PCP or go to the Emergency Dept.? Yes Was the patient provided with contact information for the PCP's office or ED? Yes Was to pt encouraged to call back with questions or concerns? Yes

## 2021-12-10 ENCOUNTER — Encounter (HOSPITAL_COMMUNITY): Payer: Self-pay

## 2021-12-10 ENCOUNTER — Emergency Department (HOSPITAL_COMMUNITY)
Admission: EM | Admit: 2021-12-10 | Discharge: 2021-12-10 | Disposition: A | Discharge status: Home / Self Care | Payer: Medicaid Other | Attending: Emergency Medicine | Admitting: Emergency Medicine

## 2021-12-10 ENCOUNTER — Other Ambulatory Visit: Payer: Self-pay

## 2021-12-10 DIAGNOSIS — Z760 Encounter for issue of repeat prescription: Secondary | ICD-10-CM | POA: Insufficient documentation

## 2021-12-10 DIAGNOSIS — I10 Essential (primary) hypertension: Secondary | ICD-10-CM | POA: Insufficient documentation

## 2021-12-10 MED ORDER — BUPRENORPHINE HCL-NALOXONE HCL 8-2 MG SL FILM: 1.0000 | ORAL_FILM | Freq: Three times a day (TID) | SUBLINGUAL | 0 refills | Status: AC | PRN

## 2021-12-10 MED ORDER — CLONAZEPAM 0.5 MG PO TABS: 0.5000 mg | ORAL_TABLET | Freq: Four times a day (QID) | ORAL | 0 refills | Status: DC

## 2021-12-10 MED ORDER — TRAMADOL HCL 50 MG PO TABS: 50.0000 mg | ORAL_TABLET | Freq: Four times a day (QID) | ORAL | 0 refills | Status: AC | PRN
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
## 2021-12-10 NOTE — Discharge Instructions (Signed)
Medications have refilled, this medication can make you extremely sleepy as well as suppress your respiratory drive do not consume this with alcohol or operate heavy machinery  Please follow-up with a pain clinic and/or PCP for further evaluation  Come back to the emergency department if you develop chest pain, shortness of breath, severe abdominal pain, uncontrolled nausea, vomiting, diarrhea.

## 2021-12-10 NOTE — ED Triage Notes (Signed)
Medication refill  Suboxone 8mg  Clonazepam 0.5mg  Tramadol 50mg 

## 2021-12-13 ENCOUNTER — Telehealth: Payer: Self-pay

## 2021-12-13 NOTE — Telephone Encounter (Signed)
Transition Care Management Unsuccessful Follow-up Telephone Call  Date of discharge and from where:  12/10/2021 from [facility]  Attempts:  1st Attempt  Reason for unsuccessful TCM follow-up call:  Left voice message

## 2021-12-14 NOTE — Telephone Encounter (Signed)
Transition Care Management Unsuccessful Follow-up Telephone Call  Date of discharge and from where:  12/10/2021 from [facility]  Attempts:  2nd Attempt  Reason for unsuccessful TCM follow-up call:  Left voice message

## 2021-12-15 NOTE — Telephone Encounter (Signed)
Transition Care Management Follow-up Telephone Call Date of discharge and from where: 12/10/2021 from [facility] How have you been since you were released from the hospital? Patient stated that he is feeling good and did not have any questions. Patient went to [facility] for a medication refill and is having trouble est with a new primary care provider. Patient given information to est with [facility].  Any questions or concerns? No  Items Reviewed: Did the pt receive and understand the discharge instructions provided? Yes  Medications obtained and verified? Yes  Other? No  Any new allergies since your discharge? No  Dietary orders reviewed? No Do you have support at home? No   Functional Questionnaire: (I = Independent and D = Dependent) ADLs: I  Bathing/Dressing- I  Meal Prep- I  Eating- I  Maintaining continence- I  Transferring/Ambulation- I  Managing Meds- I   Follow up appointments reviewed:  PCP Hospital f/u appt confirmed? No   Specialist Hospital f/u appt confirmed? No   Are transportation arrangements needed? No  If their condition worsens, is the pt aware to call PCP or go to the Emergency Dept.? Yes Was the patient provided with contact information for the PCP's office or ED? Yes Was to pt encouraged to call back with questions or concerns? Yes

## 2021-12-17 ENCOUNTER — Emergency Department (HOSPITAL_COMMUNITY)
Admission: EM | Admit: 2021-12-17 | Discharge: 2021-12-17 | Disposition: A | Discharge status: Home / Self Care | Payer: Medicaid Other | Attending: Emergency Medicine | Admitting: Emergency Medicine

## 2021-12-17 ENCOUNTER — Encounter (HOSPITAL_COMMUNITY): Payer: Self-pay

## 2021-12-17 ENCOUNTER — Other Ambulatory Visit: Payer: Self-pay

## 2021-12-17 DIAGNOSIS — F419 Anxiety disorder, unspecified: Secondary | ICD-10-CM | POA: Insufficient documentation

## 2021-12-17 DIAGNOSIS — Z760 Encounter for issue of repeat prescription: Secondary | ICD-10-CM | POA: Insufficient documentation

## 2021-12-17 DIAGNOSIS — G8929 Other chronic pain: Secondary | ICD-10-CM | POA: Insufficient documentation

## 2021-12-17 MED ORDER — CLONAZEPAM 0.5 MG PO TABS: 0.5000 mg | ORAL_TABLET | Freq: Four times a day (QID) | ORAL | 0 refills | Status: DC

## 2021-12-17 MED ORDER — BUPRENORPHINE HCL 8 MG SL SUBL: 8.0000 mg | SUBLINGUAL_TABLET | Freq: Three times a day (TID) | SUBLINGUAL | 0 refills | Status: AC

## 2021-12-17 MED ORDER — TRAMADOL HCL 50 MG PO TABS: 50.0000 mg | ORAL_TABLET | Freq: Four times a day (QID) | ORAL | 0 refills | Status: DC | PRN

## 2021-12-17 NOTE — ED Provider Notes (Signed)
[facility] EMERGENCY DEPARTMENT Provider Note   CSN: [id number] Arrival date & time: 12/17/21  1311     History  Chief Complaint  Patient presents with   Medication Refill    Geoffrey Carson is a 36 y.o. male.   Medication Refill  This patient is a 36 year old male, he has a history of fairly severe anxiety as well as chronic pain for which she takes buprenorphine as well as clonazepam.  He presents to the hospital today requesting another refill as he has not yet been able to establish with a family doctor.  I have reviewed the drug database and the patient is not getting his medications from anywhere else.  His family doctor recently retired and left him without a doctor.  He reports that he has tried to call to get an appointment but has not been able to be set up yet.  Home Medications Prior to Admission medications   Medication Sig Start Date End Date Taking? Authorizing Provider  buprenorphine (SUBUTEX) 8 MG SUBL SL tablet Place 1 tablet (8 mg total) under the tongue in the morning, at noon, and at bedtime for 7 days. 12/17/21 12/24/21 Yes Eber Hong, MD  traMADol (ULTRAM) 50 MG tablet Take 1 tablet (50 mg total) by mouth every 6 (six) hours as needed. 12/17/21  Yes Eber Hong, MD  clonazePAM (KLONOPIN) 0.5 MG tablet Take 1 tablet (0.5 mg total) by mouth 4 (four) times daily for 4 days. 12/17/21 12/21/21  Eber Hong, MD  ibuprofen (ADVIL,MOTRIN) 200 MG tablet Take 800 mg by mouth 2 (two) times daily as needed. For pain     [provider]  meclizine (ANTIVERT) 25 MG tablet Take 1 tablet (25 mg total) by mouth 3 (three) times daily as needed for dizziness. 09/03/15   York Spaniel, MD  ondansetron (ZOFRAN-ODT) 4 MG disintegrating tablet Take 1 tablet (4 mg total) by mouth every 6 (six) hours as needed for nausea or vomiting. 09/03/15   York Spaniel, MD  sertraline (ZOLOFT) 50 MG tablet Take 50 mg by mouth daily.    [provider]  topiramate  (TOPAMAX) 25 MG tablet Take one tablet at night for one week, then take 2 tablets at night for one week, then take 3 tablets at night. 09/03/15   York Spaniel, MD      Allergies    Azithromycin, Erythromycin, and Escitalopram    Review of Systems   Review of Systems  All other systems reviewed and are negative.  Physical Exam Updated Vital Signs BP (!) 130/94 (BP Location: Right Arm)    Pulse (!) 115    Temp 98.2 F (36.8 C)    Resp 16    Ht 1.88 m (6\' 2" )    Wt 90.7 kg    SpO2 100%    BMI 25.68 kg/m  Physical Exam Vitals and nursing note reviewed.  Constitutional:      General: He is not in acute distress.    Appearance: He is well-developed.  HENT:     Head: Normocephalic and atraumatic.     Mouth/Throat:     Pharynx: No oropharyngeal exudate.  Eyes:     General: No scleral icterus.       Right eye: No discharge.        Left eye: No discharge.     Conjunctiva/sclera: Conjunctivae normal.     Pupils: Pupils are equal, round, and reactive to light.  Neck:     Thyroid: No  thyromegaly.     Vascular: No JVD.  Cardiovascular:     Rate and Rhythm: Normal rate and regular rhythm.     Heart sounds: Normal heart sounds. No murmur heard.   No friction rub. No gallop.  Pulmonary:     Effort: Pulmonary effort is normal. No respiratory distress.     Breath sounds: Normal breath sounds. No wheezing or rales.  Abdominal:     General: Bowel sounds are normal. There is no distension.     Palpations: Abdomen is soft. There is no mass.     Tenderness: There is no abdominal tenderness.  Musculoskeletal:        General: No tenderness. Normal range of motion.     Cervical back: Normal range of motion and neck supple.  Lymphadenopathy:     Cervical: No cervical adenopathy.  Skin:    General: Skin is warm and dry.     Findings: No erythema or rash.  Neurological:     Mental Status: He is alert.     Coordination: Coordination normal.  Psychiatric:        Behavior: Behavior  normal.    ED Results / Procedures / Treatments   Labs (all labs ordered are listed, but only abnormal results are displayed) Labs Reviewed - No data to display  EKG None  Radiology No results found.  Procedures Procedures    Medications Ordered in ED Medications - No data to display  ED Course/ Medical Decision Making/ A&P                           Medical Decision Making  I had a fairly long talk with this patient regarding the necessity of finding a family doctor as we cannot continue to refill his medications out of the ER however these do appear chronic, he improves when he takes them, he does not appear to be abusing them and I feel comfortable giving her another couple weeks.  Patient agreeable        Final Clinical Impression(s) / ED Diagnoses Final diagnoses:  Medication refill    Rx / DC Orders ED Discharge Orders          Ordered    clonazePAM (KLONOPIN) 0.5 MG tablet  4 times daily        12/17/21 1734    traMADol (ULTRAM) 50 MG tablet  Every 6 hours PRN        12/17/21 1734    buprenorphine (SUBUTEX) 8 MG SUBL SL tablet  3 times daily        12/17/21 1734              Eber Hong, MD 12/17/21 1736

## 2021-12-17 NOTE — Discharge Instructions (Addendum)
Medications have been sent to your pharmacy, you will need to follow-up with a family doctor, please see the resource list and the family doctor list below   [facility] Primary [facility] Doctor List    Syliva Overman, MD. Specialty: [facility] Medicine Contact information: [address]  [place] [address]  [phone]   Lilyan Punt, MD. Specialty: [facility] Medicine Contact information: [address]  [place] [address]  [phone]   Avon Gully, MD Specialty: Internal Medicine Contact information: [address] [place] [address]  [phone]   Catalina Pizza, MD. Specialty: Internal Medicine Contact information: [address]  [place] [address]  [phone]    [facility] (Dr. Selena Batten) Specialty: Family Medicine Contact information: [address]  [place] [address]  [phone]   John Giovanni, MD. Specialty: [facility] Medicine Contact information: [address]  [address]  [place] [address]  [phone]   Carylon Perches, MD. Specialty: Internal Medicine Contact information: [address]  [address]  [place] [address]  [phone]    [facility]  [address] [place], [place] [address] [phone]  Services [facility] offers a variety of basic health services.  Services include but are not limited to: Blood pressure checks  Heart rate checks  Blood sugar checks  Urine analysis  Rapid strep tests  Pregnancy tests.  Health education and referrals  People needing more complex services will be directed to a physician online. Using these virtual visits, doctors can evaluate and prescribe medicine and treatments. There will be no medication on-site, though [facility] will help patients fill their prescriptions at little to no cost.   For More information please go  to: [email or web address]  Substance Abuse Treatment Programs  Intensive Outpatient Programs [facility]     [address]      [place], [place]                   [phone]       [facility] [address] [place] [address] [place], [place] [phone]  [facility]     (Inpatient and outpatient)     [address]           [phone]    [facility] [phone] (Suboxone and Methadone)  [address]      [place], [place] [address]      [phone]       [address] [place], [place] [phone]  [facility] (Outpatient/Inpatient, Chemical)    (insurance only) [phone]             [facility] (Groups & Residential) [place], [place] [phone]     [facility]     [address]     [place], [place]      [phone]       [facility] (for caregivers and family) [phone] [address] [place], [place] [phone]      Residential Treatment Programs [facility]      [address], [place], [place] [address]  [phone]       [facility] [address], [place], [place] [address] [phone]  [facility] [place]        [phone]       [facility] [phone]  [facility] (Addiction Recovery Care  Assoc.)             [address]                                         [place], [place]                                                [phone] or [phone]                               [facility] [place] [address] [place], [address] [phone]  [facility]    [address]      [place], [place]     [phone]       [facility] [address] [place], [place] [phone]  [facility]   [address] [place], [place] [address]     [phone]      Admissions: 8am-3pm  M-F  [facility] Services (RTS) [address] [place], [place] [phone]  [facility] Program: Residential Program [phone] Days)   [place], [place]      [phone] or [phone]     [facility]: [facility] [place], [place] (Walk in Hours over the weekend or by referral)  [facility] [address] [place], [place], [place] [address] [phone]  Crisis Mobile: [facility]:  [phone] (for crisis response 24 hours a day) [facility] Hotline:      (626)459-4997 Outpatient Psychiatry and Counseling  [facility]: Mobile Crisis Management 24 hours:  [phone]  [facility] of the [employer] sliding scale fee and walk in schedule: M-F 8am-12pm/1pm-3pm [address]  [place], [place] [address] [phone]  [facility] [address] [place], [place] [address] [phone]  [facility] (Formerly known as [facility] [employer])- new patient walk-in appointments available Monday - Friday 8am -3pm.          [address] [place], [place] [address] [phone] or crisis line- [phone]  [facility] Outpatient Services/ Intensive Outpatient Therapy Program [address] [place], [place] [address] [phone]  [facility] Mental Health                  Crisis Services      [phone]     [place], [place] [address]                 [facility]   [facility] [phone]. [address] [place], [place] [address]   [employer] of Care          [address] [place]  [place], [place] [address]       [phone]  [facility] [address] [place], [place] [address] [phone]  [facility]    [address]    [place], [place] [address]     [phone]       Andee Poles, MD     [facility]     [place]  [address]  [phone]       [facility] [address] [place] [address]  [facility]     [address] [place], [place]      [phone]       [facility] Eulogio Ditch, MD [address] [place], [place] [address] [phone]  [facility]     [address]     [place], [place] [address]     [phone]       [facility] [address] [place], [place] [address] [phone] Resources for Temporary Residential Assistance/Crisis Centers  DAY CENTERS [facility]) M-F 8am-3pm   [address] [place], [place] [address]   [phone] Services include: laundry, barbering, support groups, case management, phone  & computer access, showers, AA/NA mtgs, mental health/substance abuse nurse, job skills class, disability information, VA assistance, spiritual classes, etc.   HOMELESS SHELTERS  [facility] 352     [employer] [facility]   [address] [place]     [phone]              [employer] (women and children)       [address] [place], [place] [address] [phone] [email or web address] for application and process Application Required  [facility] [employer] [facility]   [address]    [place] [address]     [phone]                    [facility] [place] [address] [place], [place] [address] [phone] application appt.) Application Required  [facility] (women only)    [address]     [place], [place] [address]     [phone]      Intake starts 6pm daily Need valid ID, SSC, & Police report [employer] [address] [place], [place] [phone] Application Required  [employer] (men only)     [address]      [place], [place]     [phone]       [facility] of [facility] [place] (Pregnant  women only) [address] [place], [place] [phone]  [facility]      [address]      [place], [place] [address]     [phone]             [facility] [address] [place], [place] [phone] 90 day commitment/SA/Application process  [facility](men only)     [address]     [place], [place]     [phone]       Check-in at [facility] [address] [place], [place] [address] [phone] Men/Women/Women and Children must be there by 7 pm  [facility] [place], [place] [phone]

## 2021-12-17 NOTE — ED Notes (Signed)
ED Provider at bedside. 

## 2021-12-17 NOTE — ED Triage Notes (Signed)
Pt requests refill of Buprenorphine, Klonipin, and Tramadol. Pt spoke with [facility] employee regarding getting set up with a PCP. Pt reports that he is having trouble getting an appointment and has been told it may take up two weeks to get an appt. [facility] is helping with appt at [facility] [place].

## 2021-12-29 ENCOUNTER — Other Ambulatory Visit: Payer: Self-pay

## 2021-12-29 ENCOUNTER — Encounter (HOSPITAL_COMMUNITY): Payer: Self-pay

## 2021-12-29 ENCOUNTER — Emergency Department (HOSPITAL_COMMUNITY)
Admission: EM | Admit: 2021-12-29 | Discharge: 2021-12-29 | Disposition: A | Discharge status: Home / Self Care | Payer: Medicaid Other | Attending: Emergency Medicine | Admitting: Emergency Medicine

## 2021-12-29 DIAGNOSIS — Z760 Encounter for issue of repeat prescription: Secondary | ICD-10-CM | POA: Diagnosis not present

## 2021-12-29 DIAGNOSIS — R000 Tachycardia, unspecified: Secondary | ICD-10-CM | POA: Diagnosis not present

## 2021-12-29 DIAGNOSIS — R0789 Other chest pain: Secondary | ICD-10-CM | POA: Insufficient documentation

## 2021-12-29 DIAGNOSIS — I10 Essential (primary) hypertension: Secondary | ICD-10-CM | POA: Insufficient documentation

## 2021-12-29 DIAGNOSIS — Z79899 Other long term (current) drug therapy: Secondary | ICD-10-CM | POA: Insufficient documentation

## 2021-12-29 MED ORDER — BUPRENORPHINE HCL-NALOXONE HCL 8-2 MG SL FILM: ORAL_FILM | SUBLINGUAL | 0 refills | Status: DC

## 2021-12-29 MED ORDER — CLONAZEPAM 0.5 MG PO TABS: 0.5000 mg | ORAL_TABLET | Freq: Three times a day (TID) | ORAL | 0 refills | Status: AC | PRN

## 2021-12-29 MED ORDER — TRAMADOL HCL 50 MG PO TABS: 50.0000 mg | ORAL_TABLET | Freq: Four times a day (QID) | ORAL | 0 refills | Status: AC | PRN

## 2021-12-29 MED ORDER — BUPRENORPHINE HCL-NALOXONE HCL 8-2 MG SL SUBL
1.0000 | SUBLINGUAL_TABLET | Freq: Once | SUBLINGUAL | Status: AC
  Administered 2021-12-29: 1 via SUBLINGUAL
  Filled 2021-12-29: qty 1

## 2021-12-29 MED ORDER — BUPRENORPHINE HCL 2 MG SL SUBL: 8.0000 mg | SUBLINGUAL_TABLET | Freq: Once | SUBLINGUAL | Status: DC

## 2021-12-29 MED ORDER — CLONAZEPAM 0.5 MG PO TABS
0.5000 mg | ORAL_TABLET | Freq: Once | ORAL | Status: AC
  Administered 2021-12-29: 0.5 mg via ORAL
  Filled 2021-12-29: qty 1

## 2021-12-29 NOTE — Discharge Instructions (Signed)
As discussed, you will need to follow-up with your pain management provider.  Please keep your appointment on Monday morning

## 2021-12-29 NOTE — ED Triage Notes (Addendum)
Pt presents to ED requesting refills on Buprenorphine, Klonopin, and Tramadol. Pt states he made an appointment with PCP on Monday at 0830 with [facility]. Pt states he feels like his heart is racing. Pt states he has been without his meds for 2-3 days.

## 2021-12-29 NOTE — ED Provider Notes (Signed)
[facility] EMERGENCY DEPARTMENT Provider Note   CSN: [id number] Arrival date & time: 12/29/21  1626     History  Chief Complaint  Patient presents with   Medication Refill    Geoffrey Carson is a 36 y.o. male.   Medication Refill      Geoffrey Carson is a 36 y.o. male past medical history significant for hypertension who presents to the Emergency Department requesting refills for his buprenorphine, clonazepam and tramadol.  States that he ran out of his medications 2 days ago.  He is been having some increasing pain all over and feelings of chest tightness that has been intermittent.  He has been seen here multiple times for refills of his medications.  He was last seen here on 12/17/2021 and had refills for his Subutex and clonazepam.  States he finally has an appointment with pain management on Monday morning at 830.  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  He is requesting refills until his pain management visit.  He denies any vomiting, abdominal pain or shortness of breath.      Home Medications Prior to Admission medications   Medication Sig Start Date End Date Taking? Authorizing Provider  clonazePAM (KLONOPIN) 0.5 MG tablet Take 1 tablet (0.5 mg total) by mouth 4 (four) times daily for 4 days. 12/17/21 12/29/21 Yes Eber Hong, MD  ibuprofen (ADVIL,MOTRIN) 200 MG tablet Take 800 mg by mouth 2 (two) times daily as needed. For pain    Yes [provider]  naproxen (NAPROSYN) 500 MG tablet Take 500 mg by mouth 2 (two) times daily with a meal.   Yes [provider]  ondansetron (ZOFRAN-ODT) 4 MG disintegrating tablet Take 1 tablet (4 mg total) by mouth every 6 (six) hours as needed for nausea or vomiting. 09/03/15  Yes York Spaniel, MD  SUBOXONE 8-2 MG FILM Place under the tongue 3 (three) times daily. 12/17/21  Yes [provider]  traMADol (ULTRAM) 50 MG tablet Take 1 tablet (50 mg total) by mouth every 6 (six)  hours as needed. Patient taking differently: Take 100 mg by mouth every 6 (six) hours as needed for moderate pain. breakthrough 12/17/21  Yes Eber Hong, MD  meclizine (ANTIVERT) 25 MG tablet Take 1 tablet (25 mg total) by mouth 3 (three) times daily as needed for dizziness. Patient not taking: Reported on 12/29/2021 09/03/15   York Spaniel, MD      Allergies    Azithromycin, Erythromycin, and Escitalopram    Review of Systems   Review of Systems  Respiratory:  Positive for chest tightness. Negative for shortness of breath.   Cardiovascular:  Negative for chest pain.  Musculoskeletal:  Positive for myalgias.  Neurological:  Negative for dizziness, weakness, numbness and headaches.  All other systems reviewed and are negative.  Physical Exam Updated Vital Signs BP 131/90 (BP Location: Right Arm)    Pulse (!) 123    Temp 98.5 F (36.9 C) (Oral)    Resp 20    Ht 6\' 2"  (1.88 m)    Wt 90.7 kg    SpO2 100%    BMI 25.68 kg/m  Physical Exam Vitals and nursing note reviewed.  Constitutional:      General: He is not in acute distress.    Appearance: Normal appearance. He is not diaphoretic.     Comments: Patient is anxious appearing.  No diaphoresis.  Nontoxic-appearing.  Cardiovascular:     Rate and Rhythm: Regular  rhythm. Tachycardia present.     Pulses: Normal pulses.  Pulmonary:     Effort: Pulmonary effort is normal. No respiratory distress.  Abdominal:     Palpations: Abdomen is soft.     Tenderness: There is no abdominal tenderness.  Musculoskeletal:        General: Normal range of motion.  Skin:    General: Skin is warm.  Neurological:     General: No focal deficit present.     Mental Status: He is alert.     Sensory: No sensory deficit.     Motor: No weakness.    ED Results / Procedures / Treatments   Labs (all labs ordered are listed, but only abnormal results are displayed) Labs Reviewed - No data to display  EKG EKG Interpretation  Date/Time:  Wednesday  December 29 2021 16:49:12 EST Ventricular Rate:  119 PR Interval:  148 QRS Duration: 90 QT Interval:  330 QTC Calculation: 464 R Axis:   93 Text Interpretation: Sinus tachycardia Rightward axis Borderline ECG No previous ECGs available Confirmed by Gloris Manchester [phone]) on 12/29/2021 7:32:09 PM  Radiology No results found.  Procedures Procedures    Medications Ordered in ED Medications  buprenorphine-naloxone (SUBOXONE) 8-2 mg per SL tablet 1 tablet (has no administration in time range)  clonazePAM (KLONOPIN) tablet 0.5 mg (0.5 mg Oral Given 12/29/21 1816)    ED Course/ Medical Decision Making/ A&P                           Medical Decision Making Patient here requesting medication refill until his pain management appointment which is scheduled for Monday morning at 8:30 AM.  this is a televisit with [facility] in [place], [place].  Patient had email that confirmed his appointment.  On exam, he appears slightly anxious, vital signs show tachycardia.  Suspect there may be some withdrawal.  No history of benzo related seizures.  Amount and/or Complexity of Data Reviewed External Data Reviewed:     Details: Prior medical records reviewed by me including multiple ER visits ECG/medicine tests: ordered.    Details: EKG performed.  Shows sinus tachycardia  Risk Prescription drug management.  On recheck, patient has been given dose of clonazepam and Suboxone here.  He appears to be more calm and vital signs now reassuring.  No longer tachycardic.  I had a long discussion with the patient about ER visits for pain management.  He voiced understanding that he needs to follow-up with pain management and understands that the ER is not to be utilized for chronic pain control.  Database reviewed, patient showed me email confirmation of upcoming appointment, I am concerned about possible withdrawal symptoms including benzo related seizure so I will provide enough medication until seen for his  upcoming appointment.  No concerning symptoms for emergent processes this time.  He appears appropriate for discharge home and agrees to keep his upcoming appointment.         Final Clinical Impression(s) / ED Diagnoses Final diagnoses:  Medication refill    Rx / DC Orders ED Discharge Orders     None         Rosey Bath 12/29/21 1934    Gloris Manchester, MD 12/30/21 [phone]

## 2021-12-30 ENCOUNTER — Telehealth: Payer: Self-pay

## 2021-12-30 DIAGNOSIS — Z789 Other specified health status: Secondary | ICD-10-CM

## 2021-12-30 NOTE — Telephone Encounter (Signed)
Transition Care Management Unsuccessful Follow-up Telephone Call  Date of discharge and from where:  12/29/2021 from [facility]  Attempts:  1st Attempt  Reason for unsuccessful TCM follow-up call:  Voice mail full

## 2021-12-31 NOTE — Telephone Encounter (Signed)
Transition Care Management Unsuccessful Follow-up Telephone Call  Date of discharge and from where:  12/29/2021-[place]   Attempts:  2nd Attempt  Reason for unsuccessful TCM follow-up call:  Voice mail full

## 2022-01-03 DIAGNOSIS — M545 Low back pain, unspecified: Secondary | ICD-10-CM | POA: Diagnosis not present

## 2022-01-03 DIAGNOSIS — G8929 Other chronic pain: Secondary | ICD-10-CM | POA: Diagnosis not present

## 2022-01-03 DIAGNOSIS — M961 Postlaminectomy syndrome, not elsewhere classified: Secondary | ICD-10-CM | POA: Diagnosis not present

## 2022-01-03 DIAGNOSIS — M542 Cervicalgia: Secondary | ICD-10-CM | POA: Diagnosis not present

## 2022-01-03 DIAGNOSIS — Z79891 Long term (current) use of opiate analgesic: Secondary | ICD-10-CM | POA: Diagnosis not present

## 2022-01-03 NOTE — Telephone Encounter (Signed)
Transition Care Management Unsuccessful Follow-up Telephone Call  Date of discharge and from where:  12/29/2021-[place]   Attempts:  3rd Attempt  Reason for unsuccessful TCM follow-up call:  Voice mail full

## 2022-02-14 DIAGNOSIS — Z79899 Other long term (current) drug therapy: Secondary | ICD-10-CM | POA: Diagnosis not present

## 2022-02-14 DIAGNOSIS — Z5181 Encounter for therapeutic drug level monitoring: Secondary | ICD-10-CM | POA: Diagnosis not present

## 2022-02-14 DIAGNOSIS — Z1159 Encounter for screening for other viral diseases: Secondary | ICD-10-CM | POA: Diagnosis not present

## 2022-02-14 DIAGNOSIS — Z1389 Encounter for screening for other disorder: Secondary | ICD-10-CM | POA: Diagnosis not present

## 2022-02-16 ENCOUNTER — Emergency Department (HOSPITAL_COMMUNITY): Payer: Medicaid Other

## 2022-02-16 ENCOUNTER — Encounter (HOSPITAL_COMMUNITY): Payer: Self-pay | Admitting: *Deleted

## 2022-02-16 ENCOUNTER — Other Ambulatory Visit: Payer: Self-pay

## 2022-02-16 ENCOUNTER — Emergency Department (HOSPITAL_COMMUNITY)
Admission: EM | Admit: 2022-02-16 | Discharge: 2022-02-16 | Disposition: A | Discharge status: Home / Self Care | Payer: Medicaid Other | Attending: Emergency Medicine | Admitting: Emergency Medicine

## 2022-02-16 DIAGNOSIS — R0989 Other specified symptoms and signs involving the circulatory and respiratory systems: Secondary | ICD-10-CM | POA: Diagnosis not present

## 2022-02-16 DIAGNOSIS — F419 Anxiety disorder, unspecified: Secondary | ICD-10-CM | POA: Insufficient documentation

## 2022-02-16 DIAGNOSIS — R002 Palpitations: Secondary | ICD-10-CM | POA: Insufficient documentation

## 2022-02-16 DIAGNOSIS — I10 Essential (primary) hypertension: Secondary | ICD-10-CM | POA: Diagnosis not present

## 2022-02-16 DIAGNOSIS — R079 Chest pain, unspecified: Secondary | ICD-10-CM | POA: Diagnosis not present

## 2022-02-16 DIAGNOSIS — R0789 Other chest pain: Secondary | ICD-10-CM | POA: Insufficient documentation

## 2022-02-16 DIAGNOSIS — R072 Precordial pain: Secondary | ICD-10-CM | POA: Diagnosis present

## 2022-02-16 LAB — BASIC METABOLIC PANEL
Anion gap: 7 (ref 5–15)
BUN: 11 mg/dL (ref 6–20)
CO2: 30 mmol/L (ref 22–32)
Calcium: 9.3 mg/dL (ref 8.9–10.3)
Chloride: 101 mmol/L (ref 98–111)
Creatinine, Ser: 0.8 mg/dL (ref 0.61–1.24)
GFR, Estimated: >60 mL/min (ref >60)
Glucose, Bld: 109 mg/dL — ABNORMAL HIGH (ref 70–99)
Potassium: 3.7 mmol/L (ref 3.5–5.1)
Sodium: 138 mmol/L (ref 135–145)

## 2022-02-16 LAB — CBC
HCT: 40.8 % (ref 39.0–52.0)
Hemoglobin: 13.8 g/dL (ref 13.0–17.0)
MCH: 30.3 pg (ref 26.0–34.0)
MCHC: 33.8 g/dL (ref 30.0–36.0)
MCV: 89.5 fL (ref 80.0–100.0)
Platelets: 221 10*3/uL (ref 150–400)
RBC: 4.56 MIL/uL (ref 4.22–5.81)
RDW: 12.5 % (ref 11.5–15.5)
WBC: 5.2 10*3/uL (ref 4.0–10.5)
nRBC: 0 % (ref 0.0–0.2)

## 2022-02-16 LAB — TROPONIN I (HIGH SENSITIVITY)
Troponin I (High Sensitivity): <2 ng/L (ref <18)
Troponin I (High Sensitivity): 2 ng/L (ref <18)

## 2022-02-16 LAB — TSH: TSH: 1.312 u[IU]/mL (ref 0.350–4.500)

## 2022-02-16 MED ORDER — HYDROXYZINE HCL 25 MG PO TABS: 25.0000 mg | ORAL_TABLET | Freq: Four times a day (QID) | ORAL | 0 refills | Status: DC | PRN

## 2022-02-16 MED ORDER — HYDROXYZINE HCL 25 MG PO TABS
25.0000 mg | ORAL_TABLET | Freq: Once | ORAL | Status: AC
  Administered 2022-02-16: 25 mg via ORAL
  Filled 2022-02-16: qty 1

## 2022-02-16 NOTE — ED Triage Notes (Signed)
Pt c/o chest pain with sob for the last couple of days; pt states he feels tightness in his throat; ?

## 2022-02-16 NOTE — ED Notes (Signed)
VS taken and pt updated on bed status ?

## 2022-02-16 NOTE — Discharge Instructions (Addendum)
You have been prescribed a small quantity of hydroxyzine which may help alleviate your symptoms.  Please call the cardiologist tomorrow for an office visit.  Although we find no heart arrhythmia here you may benefit from wearing a heart monitor over a long period of time to assess your symptoms.  A thyroid test has been sent tonight to rule out hyperthyroidism as a source of your symptoms. ?

## 2022-02-16 NOTE — ED Provider Notes (Signed)
?[facility] ?Provider Note ? ? ?CSN: [id number] ?Arrival date & time: 02/16/22  1318 ? ?  ? ?History ? ?Chief Complaint  ?Patient presents with  ? Chest Pain  ? ? ?Geoffrey Carson is a 36 y.o. male presenting with a 2-day history of mid to left sternal chest pressure in association with a globus sensation which radiates up into his neck region.  He also states he feels like his heart is racing, even currently during this interview he has complaints of his heart racing despite his rate being 75 on the monitor.  He states he has been able to briefly control the symptoms with resting.  He does have a history of anxiety and has had panic attacks in the past and endorses that this may be a panic attack but this is different as it is lasted much longer than typical.  He denies diaphoresis, nausea or vomiting, dizziness with these episodes.  He has had no peripheral edema, denies leg pain or unilateral leg swelling.  He has been on clonazepam in the past, his primary provider took him off of this medication it was a slow taper and he last took this medication 2 months ago so it is not felt to be a withdrawal symptom from this medication. ? ?The history is provided by the patient.  ? ?HPI: A 36 year old patient with a history of hypertension presents for evaluation of chest pain. Initial onset of pain was more than 6 hours ago. The patient's chest pain is described as heaviness/pressure/tightness and is not worse with exertion. The patient's chest pain is middle- or left-sided, is not well-localized, is not sharp and does not radiate to the arms/jaw/neck. The patient does not complain of nausea and denies diaphoresis. The patient has no history of stroke, has no history of peripheral artery disease, has not smoked in the past 90 days, denies any history of treated diabetes, has no relevant family history of coronary artery disease (first degree relative at less than age 21), has no history of  hypercholesterolemia and does not have an elevated BMI (>=30).  ? ?Home Medications ?Prior to Admission medications   ?Medication Sig Start Date End Date Taking? Authorizing Provider  ?hydrOXYzine (ATARAX) 25 MG tablet Take 1 tablet (25 mg total) by mouth every 6 (six) hours as needed for anxiety. 02/16/22  Yes Evalee Jefferson, PA-C  ?Buprenorphine HCl-Naloxone HCl (SUBOXONE) 8-2 MG FILM Placed under the tongue 3 times daily 12/29/21   Triplett, Tammy, PA-C  ?clonazePAM (KLONOPIN) 0.5 MG tablet Take 1 tablet (0.5 mg total) by mouth 3 (three) times daily as needed for anxiety. 12/29/21   Triplett, Tammy, PA-C  ?ibuprofen (ADVIL,MOTRIN) 200 MG tablet Take 800 mg by mouth 2 (two) times daily as needed. For pain     [provider]  ?meclizine (ANTIVERT) 25 MG tablet Take 1 tablet (25 mg total) by mouth 3 (three) times daily as needed for dizziness. ?Patient not taking: Reported on 12/29/2021 09/03/15   Kathrynn Ducking, MD  ?naproxen (NAPROSYN) 500 MG tablet Take 500 mg by mouth 2 (two) times daily with a meal.    [provider]  ?ondansetron (ZOFRAN-ODT) 4 MG disintegrating tablet Take 1 tablet (4 mg total) by mouth every 6 (six) hours as needed for nausea or vomiting. 09/03/15   Kathrynn Ducking, MD  ?traMADol (ULTRAM) 50 MG tablet Take 1 tablet (50 mg total) by mouth every 6 (six) hours as needed. 12/29/21   Triplett, Lynelle Smoke, PA-C  ?   ? ?  Allergies    ?Azithromycin, Erythromycin, and Escitalopram   ? ?Review of Systems   ?Review of Systems  ?Constitutional:  Negative for fever.  ?HENT:  Negative for congestion and sore throat.   ?     Globus sensation  ?Eyes: Negative.   ?Respiratory:  Positive for shortness of breath. Negative for chest tightness.   ?Cardiovascular:  Positive for chest pain and palpitations. Negative for leg swelling.  ?Gastrointestinal:  Negative for abdominal pain and nausea.  ?Genitourinary: Negative.   ?Musculoskeletal:  Negative for arthralgias, joint swelling and neck pain.  ?Skin:  Negative.  Negative for rash and wound.  ?Neurological:  Negative for dizziness, weakness, light-headedness, numbness and headaches.  ?Psychiatric/Behavioral: Negative.    ?All other systems reviewed and are negative. ? ?Physical Exam ?Updated Vital Signs ?BP 136/78 (BP Location: Right Arm)   Pulse 69   Temp 97.9 ?F (36.6 ?C) (Oral)   Resp 15   Ht 6' 2"  (1.88 m)   Wt 90.7 kg   SpO2 100%   BMI 25.68 kg/m?  ?Physical Exam ?Vitals and nursing note reviewed.  ?Constitutional:   ?   Appearance: He is well-developed.  ?HENT:  ?   Head: Normocephalic and atraumatic.  ?Eyes:  ?   Conjunctiva/sclera: Conjunctivae normal.  ?Neck:  ?   Thyroid: No thyromegaly.  ?Cardiovascular:  ?   Rate and Rhythm: Normal rate and regular rhythm.  ?   Heart sounds: Normal heart sounds. No murmur heard. ?Pulmonary:  ?   Effort: Pulmonary effort is normal. No tachypnea or respiratory distress.  ?   Breath sounds: Normal breath sounds. No wheezing, rhonchi or rales.  ?Chest:  ?   Chest wall: Tenderness present. No mass.  ?   Comments: Mild tenderness left pectoralis (described as bruised sensation). ?Abdominal:  ?   General: Bowel sounds are normal.  ?   Palpations: Abdomen is soft.  ?   Tenderness: There is no abdominal tenderness.  ?Musculoskeletal:     ?   General: Normal range of motion.  ?   Cervical back: Normal range of motion.  ?Skin: ?   General: Skin is warm and dry.  ?Neurological:  ?   Mental Status: He is alert.  ?Psychiatric:     ?   Mood and Affect: Mood is anxious.     ?   Behavior: Behavior normal.  ? ? ?ED Results / Procedures / Treatments   ?Labs ?(all labs ordered are listed, but only abnormal results are displayed) ?Labs Reviewed  ?BASIC METABOLIC PANEL - Abnormal; Notable for the following components:  ?    Result Value  ? Glucose, Bld 109 (*)   ? All other components within normal limits  ?CBC  ?TSH  ?TROPONIN I (HIGH SENSITIVITY)  ?TROPONIN I (HIGH SENSITIVITY)  ? ? ?EKG ?EKG Interpretation ? ?Date/Time:  Wednesday  February 16 2022 13:34:21 EDT ?Ventricular Rate:  103 ?PR Interval:  142 ?QRS Duration: 88 ?QT Interval:  336 ?QTC Calculation: 440 ?R Axis:   89 ?Text Interpretation: Sinus tachycardia Otherwise normal ECG When compared with ECG of 29-Dec-2021 16:49, No significant change was found Confirmed by Milton Ferguson [phone]) on 02/16/2022 8:24:03 PM ? ?Radiology ?DG Chest 2 View ? ?Result Date: 02/16/2022 ?CLINICAL DATA:  Chest pain EXAM: CHEST - 2 VIEW COMPARISON:  None. FINDINGS: The heart size and mediastinal contours are within normal limits. Both lungs are clear. No pleural effusion or pneumothorax. The visualized skeletal structures are unremarkable. IMPRESSION: No active cardiopulmonary disease. Electronically  Signed   By: Macy Mis M.D.   On: 02/16/2022 13:47   ? ?Procedures ?Procedures  ? ? ?Medications Ordered in ED ?Medications  ?hydrOXYzine (ATARAX) tablet 25 mg (has no administration in time range)  ? ? ?ED Course/ Medical Decision Making/ A&P ?  ?HEAR Score: 2                       ?Medical Decision Making ?Patient with a several day history of palpations despite relatively normal pulse rates here.  Also has what he describes as a globus sensation in his throat.  Does have a history of anxiety and it certainly is possible that this is a worse than normal anxiety episode, however given his chest symptoms, it is possible he is having palpitations, although I have not been able to capture them here upon review of his monitor course.  He may benefit from cardiology consultation to consider wearing an event monitor.  Vital signs are stable, doubt PE given normal vitals and no hypoxia. ? ?Amount and/or Complexity of Data Reviewed ?Labs: ordered. ?   Details: Review of CBC, be met, delta troponins which are negative.  ACS unlikely.  A TSH has been added to patient's work-up. ?Radiology: ordered. ?   Details: Reviewed and negative. ?ECG/medicine tests: ordered. ?   Details: Sinus tachy at 103.  He was initially  borderline tachycardic upon first arrival but subsequent pulse rates have been in the low to mid 70 range. ? ?Risk ?Prescription drug management. ? ? ? ? ? ? ? ? ? ? ?Final Clinical Impression(s) / ED Diagnoses ?Final d

## 2022-02-17 ENCOUNTER — Telehealth: Payer: Self-pay

## 2022-02-17 NOTE — Telephone Encounter (Signed)
Transition Care Management Follow-up Telephone Call ?Date of discharge and from where: 02/16/2022-[place]  ?How have you been since you were released from the hospital? Pt stated he is doing ok. Has scheduled his follow up per discharge instructions.  ?Any questions or concerns? No ? ?Items Reviewed: ?Did the pt receive and understand the discharge instructions provided? Yes  ?Medications obtained and verified? Yes  ?Other? No  ?Any new allergies since your discharge? No  ?Dietary orders reviewed? No ?Do you have support at home? Yes  ? ?Home Care and Equipment/Supplies: ?Were home health services ordered? not applicable ?If so, what is the name of the agency? N/A  ?Has the agency set up a time to come to the patient's home? not applicable ?Were any new equipment or medical supplies ordered?  No ?What is the name of the medical supply agency? N/A ?Were you able to get the supplies/equipment? not applicable ?Do you have any questions related to the use of the equipment or supplies? No ? ?Functional Questionnaire: (I = Independent and D = Dependent) ?ADLs: I ? ?Bathing/Dressing- I ? ?Meal Prep- I ? ?Eating- I ? ?Maintaining continence- I ? ?Transferring/Ambulation- I ? ?Managing Meds- I ? ?Follow up appointments reviewed: ? ?PCP Hospital f/u appt confirmed? No  confirmed? Yes  Scheduled to see Cardiology. ?Are transportation arrangements needed? No  ?If their condition worsens, is the pt aware to call PCP or go to the Emergency Dept.? Yes ?Was the patient provided with contact information for the PCP's office or ED? Yes ?Was to pt encouraged to call back with questions or concerns? Yes  ?

## 2022-03-15 ENCOUNTER — Ambulatory Visit (INDEPENDENT_AMBULATORY_CARE_PROVIDER_SITE_OTHER): Payer: Medicaid Other

## 2022-03-15 ENCOUNTER — Other Ambulatory Visit: Payer: Self-pay | Admitting: Cardiology

## 2022-03-15 ENCOUNTER — Encounter: Payer: Self-pay | Admitting: Cardiology

## 2022-03-15 ENCOUNTER — Ambulatory Visit: Payer: Medicaid Other | Admitting: Cardiology

## 2022-03-15 VITALS — BP 126/82 | HR 106 | Ht 74.0 in | Wt 198.0 lb

## 2022-03-15 DIAGNOSIS — R002 Palpitations: Secondary | ICD-10-CM | POA: Diagnosis not present

## 2022-03-15 MED ORDER — HYDROXYZINE HCL 25 MG PO TABS
25.0000 mg | ORAL_TABLET | Freq: Four times a day (QID) | ORAL | 1 refills | Status: AC | PRN
Start: 2022-03-15 — End: ?

## 2022-03-15 NOTE — Progress Notes (Signed)
? ? ? ?Clinical Summary ?Geoffrey Carson is a 36 y.o.male seen as new patient for the following medical problems. ? ? ?1.Palpitations/chest pain ?- mild symptoms for some time ?-tro neg x 2, TSH 1.3. CXR no acute process ?- EKG mild sinus tach ?- history of anxiety/panic attacks ? ? ?-was at home walking around. Thought maybe he had pulled a chest muscle, had been moving furniture. Then racing heart, some SOB. Went and layed down, was able to nap for a time. Started to ease up, started to work again and repeat episode. Waited 2 day before going to ER, on and off symptoms.  ?- high stress level ?- no coffee, Dr peppers 5 12 oz cans, no tea, no energy drinks, no EtOH ?- has had some recurrent symptoms since, palpitations. Usually with activity, occurs daily.  ?- some recent DOE, fatigue.  ?  ? ? ? ? ?Past Medical History:  ?Diagnosis Date  ? Common migraine 09/03/2015  ? Hypertension   ? Post concussion syndrome 09/03/2015  ? 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 ? ? ?Allergies  ?Allergen Reactions  ? Azithromycin   ? Erythromycin Hives  ? Escitalopram   ? ? ? ?Current Outpatient Medications  ?Medication Sig Dispense Refill  ? Buprenorphine HCl-Naloxone HCl (SUBOXONE) 8-2 MG FILM Placed under the tongue 3 times daily 12 each 0  ? clonazePAM (KLONOPIN) 0.5 MG tablet Take 1 tablet (0.5 mg total) by mouth 3 (three) times daily as needed for anxiety. 12 tablet 0  ? hydrOXYzine (ATARAX) 25 MG tablet Take 1 tablet (25 mg total) by mouth every 6 (six) hours as needed for anxiety. 20 tablet 0  ? ibuprofen (ADVIL,MOTRIN) 200 MG tablet Take 800 mg by mouth 2 (two) times daily as needed. For pain     ? meclizine (ANTIVERT) 25 MG tablet Take 1 tablet (25 mg total) by mouth 3 (three) times daily as needed for dizziness. (Patient not taking: Reported on 12/29/2021) 60 tablet 3  ? naproxen (NAPROSYN) 500 MG tablet Take 500 mg by mouth 2 (two) times daily with a meal.    ? ondansetron (ZOFRAN-ODT) 4 MG disintegrating tablet Take 1 tablet  (4 mg total) by mouth every 6 (six) hours as needed for nausea or vomiting. 30 tablet 3  ? traMADol (ULTRAM) 50 MG tablet Take 1 tablet (50 mg total) by mouth every 6 (six) hours as needed. 16 tablet 0  ? ?No current facility-administered medications for this visit.  ? ? ? ?Past Surgical History:  ?Procedure Laterality Date  ? BACK SURGERY    ? HERNIA REPAIR    ? ORTHOPEDIC SURGERY    ? ? ? ?Allergies  ?Allergen Reactions  ? Azithromycin   ? Erythromycin Hives  ? Escitalopram   ? ? ? ? ?Family History  ?Problem Relation Age of Onset  ? Arthritis Father   ? Hypertension Father   ? Migraines Father   ? Diabetes Father   ? Diabetes Maternal Grandmother   ? Diabetes Maternal Grandfather   ? Diabetes Paternal Grandmother   ? Diabetes Paternal Grandfather   ? Cancer Mother   ?     breast  ? Diabetes Mother   ? Celiac disease Sister   ? ? ? ?Social History ?Geoffrey Carson reports that he quit smoking about 7 years ago. He smoked an average of .5 packs per day. He has never used smokeless tobacco. ?Geoffrey Carson reports no history of alcohol use. ? ? ?Review of Systems ?CONSTITUTIONAL: No weight loss,  fever, chills, weakness or fatigue.  ?HEENT: Eyes: No visual loss, blurred vision, double vision or yellow sclerae.No hearing loss, sneezing, congestion, runny nose or sore throat.  ?SKIN: No rash or itching.  ?CARDIOVASCULAR: per hpi ?RESPIRATORY: No shortness of breath, cough or sputum.  ?GASTROINTESTINAL: No anorexia, nausea, vomiting or diarrhea. No abdominal pain or blood.  ?GENITOURINARY: No burning on urination, no polyuria ?NEUROLOGICAL: No headache, dizziness, syncope, paralysis, ataxia, numbness or tingling in the extremities. No change in bowel or bladder control.  ?MUSCULOSKELETAL: No muscle, back pain, joint pain or stiffness.  ?LYMPHATICS: No enlarged nodes. No history of splenectomy.  ?PSYCHIATRIC: No history of depression or anxiety.  ?ENDOCRINOLOGIC: No reports of sweating, cold or heat intolerance. No polyuria or  polydipsia.  ?. ? ? ?Physical Examination ?Today's Vitals  ? 03/15/22 1507  ?BP: 126/82  ?Pulse: (!) 106  ?SpO2: 98%  ?Weight: 198 lb (89.8 kg)  ?Height: 6\' 2"  (1.88 m)  ? ?Body mass index is 25.42 kg/m?. ? ?Gen: resting comfortably, no acute distress ?HEENT: no scleral icterus, pupils equal round and reactive, no palptable cervical adenopathy,  ?CV: RRR, no m/r/g no jvd ?Resp: Clear to auscultation bilaterally ?GI: abdomen is soft, non-tender, non-distended, normal bowel sounds, no hepatosplenomegaly ?MSK: extremities are warm, no edema.  ?Skin: warm, no rash ?Neuro:  no focal deficits ?Psych: appropriate affect ? ? ? ? ? ?Assessment and Plan  ?Palpitations ?- will plan for 7 day zio patch ?- if benign may be related to significant anxiety history, which may still benefit from trial of beta blocker in the future.  ? ? ?I did refill his prn atarax which was given in ER, he is in between PCPs. Would not manage any other pain or anxiety meds ? ? ? ? , M.D. ?

## 2022-03-15 NOTE — Patient Instructions (Addendum)
Medication Instructions:  °Your physician recommends that you continue on your current medications as directed. Please refer to the Current Medication list given to you today. ° °Labwork: °none ° °Testing/Procedures: °ZIO- Long Term Monitor Instructions  ° °Your physician has requested you wear your ZIO patch monitor 7 days.  ° °This is a single patch monitor.  [employer] supplies one patch monitor per enrollment.  Additional stickers are not available. °  °Please do not apply patch if you will be having a Nuclear Stress Test, Echocardiogram, Cardiac CT, MRI, or Chest Xray during the time frame you would be wearing the monitor. The patch cannot be worn during these tests.  You cannot remove and re-apply the ZIO XT patch monitor. °  °Your ZIO patch monitor will be sent USPS Priority mail from [employer] directly to your home address. The monitor may also be mailed to a PO BOX if home delivery is not available.   It may take 3-5 days to receive your monitor after you have been enrolled. °  °Once you have received you monitor, please review enclosed instructions.  Your monitor has already been registered assigning a specific monitor serial # to you. ° ° °Applying the monitor  ° °Shave hair from upper left chest. °  °Hold abrader disc by orange tab.  Rub abrader in 40 strokes over left upper chest as indicated in your monitor instructions. °  °Clean area with 4 enclosed alcohol pads .  Use all pads to assure are is cleaned thoroughly.  Let dry.  ° °Apply patch as indicated in monitor instructions.  Patch will be place under collarbone on left side of chest with arrow pointing upward. °  °Rub patch adhesive wings for 2 minutes.Remove white label marked "1".  Remove white label marked "2".  Rub patch adhesive wings for 2 additional minutes. °  °While looking in a mirror, press and release button in center of patch.  A small green light will flash 3-4 times .  This will be your only indicator the monitor has been  turned on. °    °Do not shower for the first 24 hours.  You may shower after the first 24 hours. °  °Press button if you feel a symptom. You will hear a small click.  Record Date, Time and Symptom in the Patient Log Book. °  °When you are ready to remove patch, follow instructions on last 2 pages of Patient Log Book.  Stick patch monitor onto last page of Patient Log Book. °  °Place Patient Log Book in Blue box.  Use locking tab on box and tape box closed securely.  The Orange and White box has prepaid postage on it.  Please place in mailbox as soon as possible.  Your physician should have your test results approximately 7 days after the monitor has been mailed back to [employer]. °  °Call [employer] Customer Care at [phone] if you have questions regarding your ZIO XT patch monitor.  Call them immediately if you see an orange light blinking on your monitor. °  °If your monitor falls off in less than 4 days contact our Monitor department at [phone].  If your monitor becomes loose or falls off after 4 days call [employer] at [phone] for suggestions on securing your monitor. ° °Follow-Up: °Your physician recommends that you schedule a follow-up appointment in: pending ° °Any Other Special Instructions Will Be Listed Below (If Applicable). ° °If you need a refill on your cardiac medications before   your next appointment, please call your pharmacy. °

## 2022-03-19 DIAGNOSIS — R002 Palpitations: Secondary | ICD-10-CM

## 2022-04-12 ENCOUNTER — Ambulatory Visit (HOSPITAL_COMMUNITY)
Admission: RE | Admit: 2022-04-12 | Discharge: 2022-04-12 | Disposition: A | Discharge status: Home / Self Care | Payer: Medicaid Other | Source: Ambulatory Visit | Attending: Physician Assistant | Admitting: Physician Assistant

## 2022-04-12 ENCOUNTER — Other Ambulatory Visit (HOSPITAL_COMMUNITY): Payer: Self-pay | Admitting: Physician Assistant

## 2022-04-12 DIAGNOSIS — M545 Low back pain, unspecified: Secondary | ICD-10-CM

## 2022-04-12 DIAGNOSIS — M5136 Other intervertebral disc degeneration, lumbar region: Secondary | ICD-10-CM | POA: Diagnosis not present

## 2022-04-12 DIAGNOSIS — M542 Cervicalgia: Secondary | ICD-10-CM | POA: Insufficient documentation

## 2022-04-12 DIAGNOSIS — M50322 Other cervical disc degeneration at C5-C6 level: Secondary | ICD-10-CM | POA: Diagnosis not present

## 2022-05-17 ENCOUNTER — Encounter: Payer: Self-pay | Admitting: *Deleted

## 2022-05-17 ENCOUNTER — Telehealth: Payer: Self-pay | Admitting: *Deleted

## 2022-05-17 NOTE — Telephone Encounter (Signed)
Lesle Chris, LPN  6/76/7209 47:09 AM EDT Back to Top    Correction, no pcp listed.    Lesle Chris, LPN  05/04/3661 94:76 AM EDT     Notified via my chart.  Copy to pcp.    Antoine Poche, MD  05/15/2022 12:05 PM EDT     Heart monitor shows some occasional extra heart beats, these are not dangerous but can cause feeling of heart skipping or fluttering. Can he start lopressor 12.5mg  bid to see if helps with symptoms. Can f/u with [place] 6 months, keep [place] updated on symptoms as we can titrate medicatoin further if needed   Dominga Ferry MD

## 2022-06-09 ENCOUNTER — Other Ambulatory Visit: Payer: Self-pay | Admitting: *Deleted

## 2022-06-09 MED ORDER — METOPROLOL TARTRATE 25 MG PO TABS: 12.5000 mg | ORAL_TABLET | Freq: Two times a day (BID) | ORAL | 6 refills | Status: DC

## 2022-07-28 IMAGING — DX DG CERVICAL SPINE COMPLETE 4+V
6 series · 6 of 6 positions shown · non-contrast
Comparison: Cervical spine MRI 10/03/2017

CLINICAL DATA: Cervicalgia neck pain on the left.

EXAM:
CERVICAL SPINE - COMPLETE 4+ VIEW

[c-spine lat]
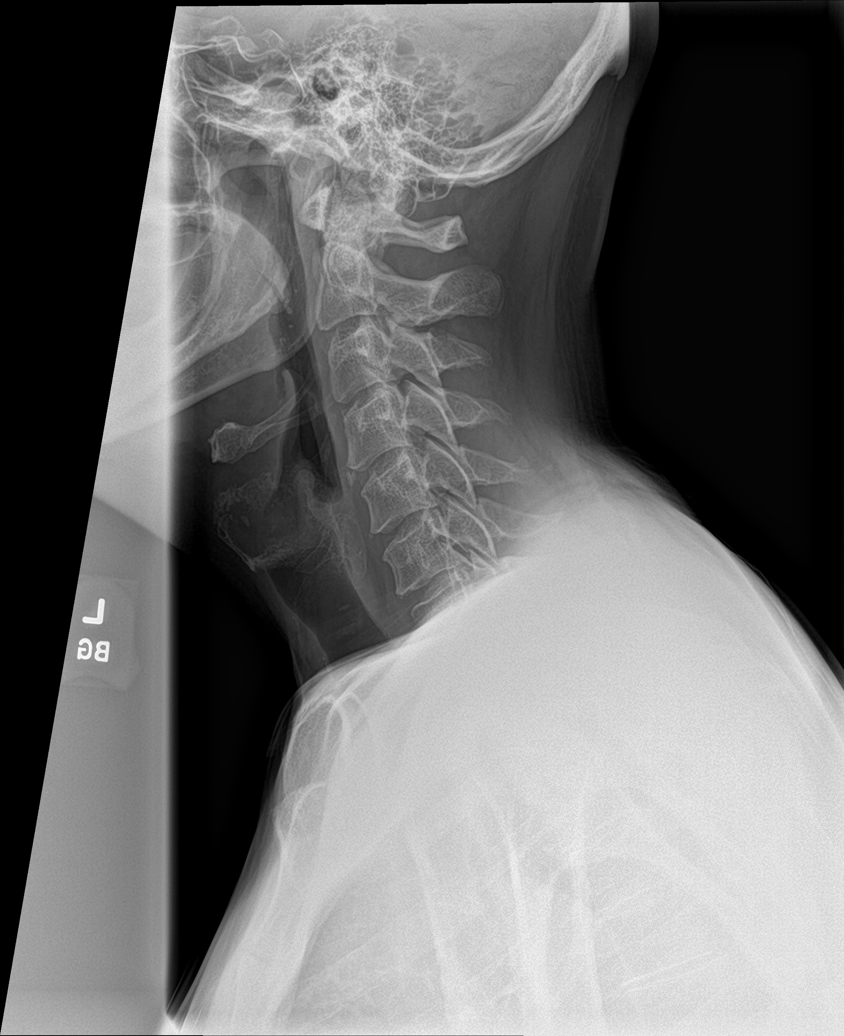

[c-spine obl (1 of 2)]
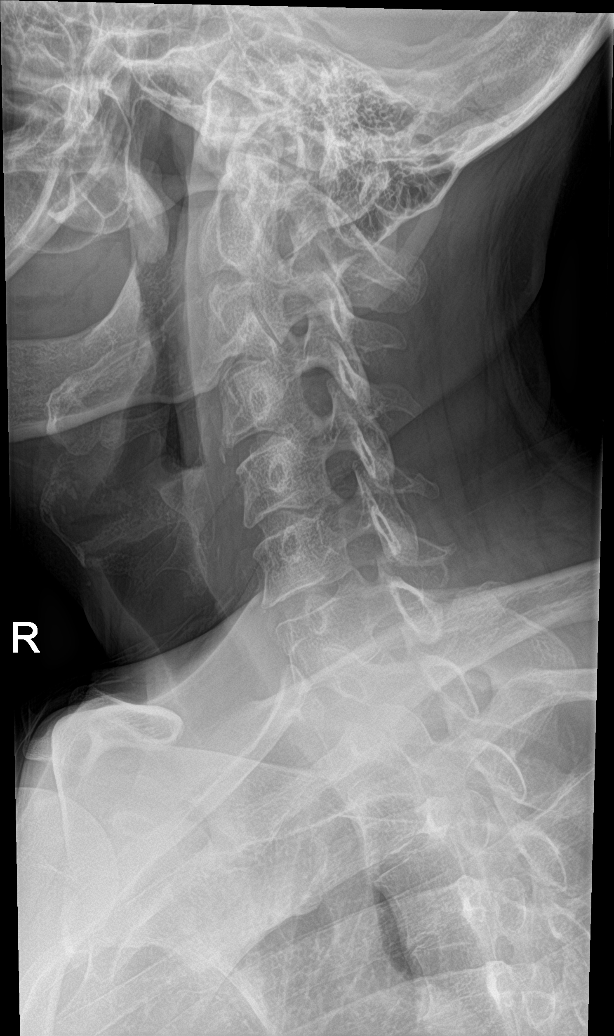

[c-spine obl (2 of 2)]
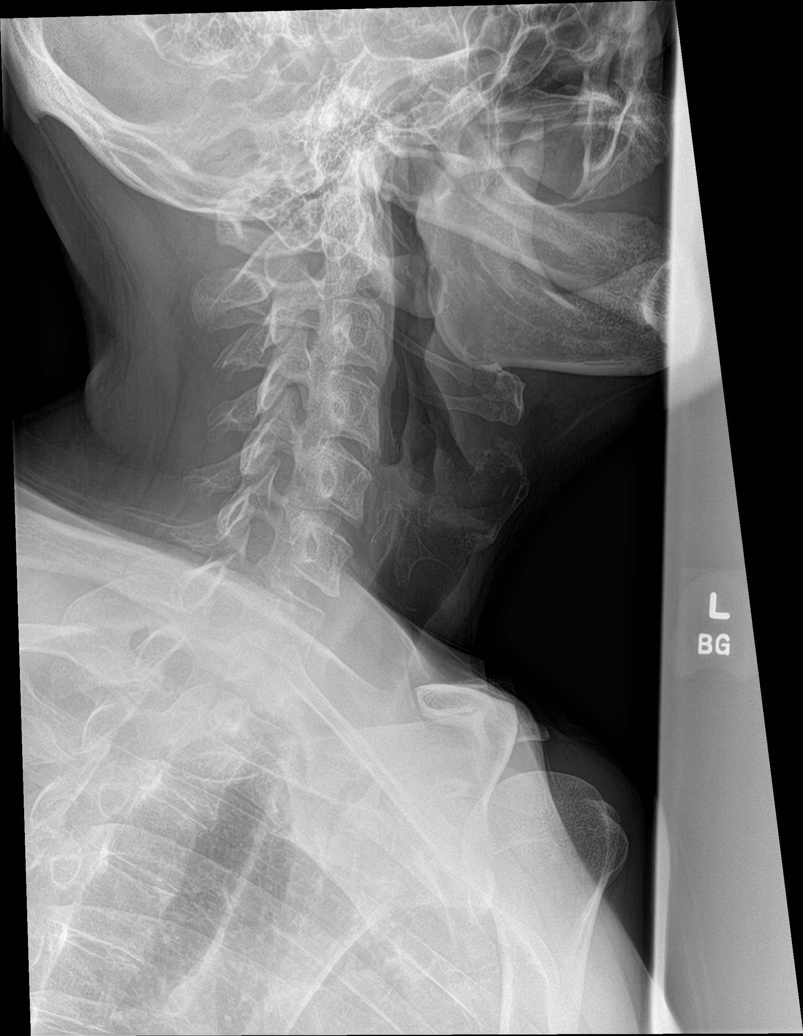

[c-spine ap]
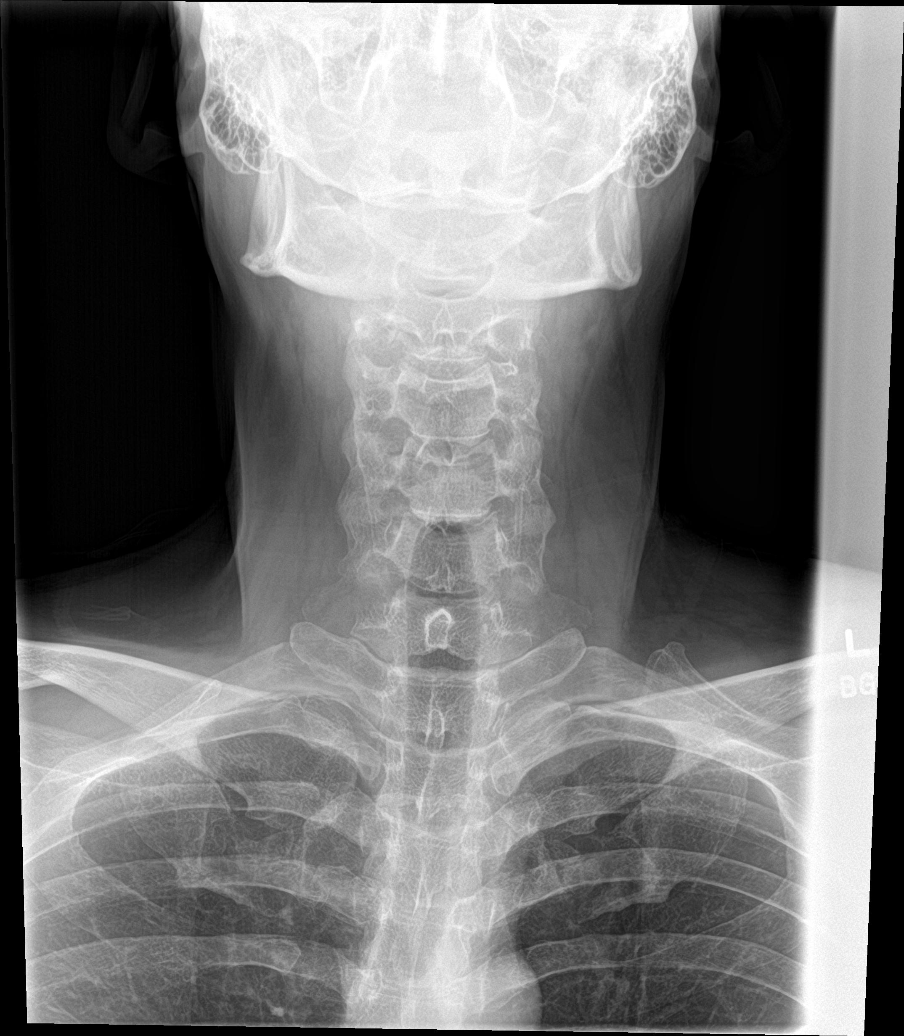

[c-spine open mouth]
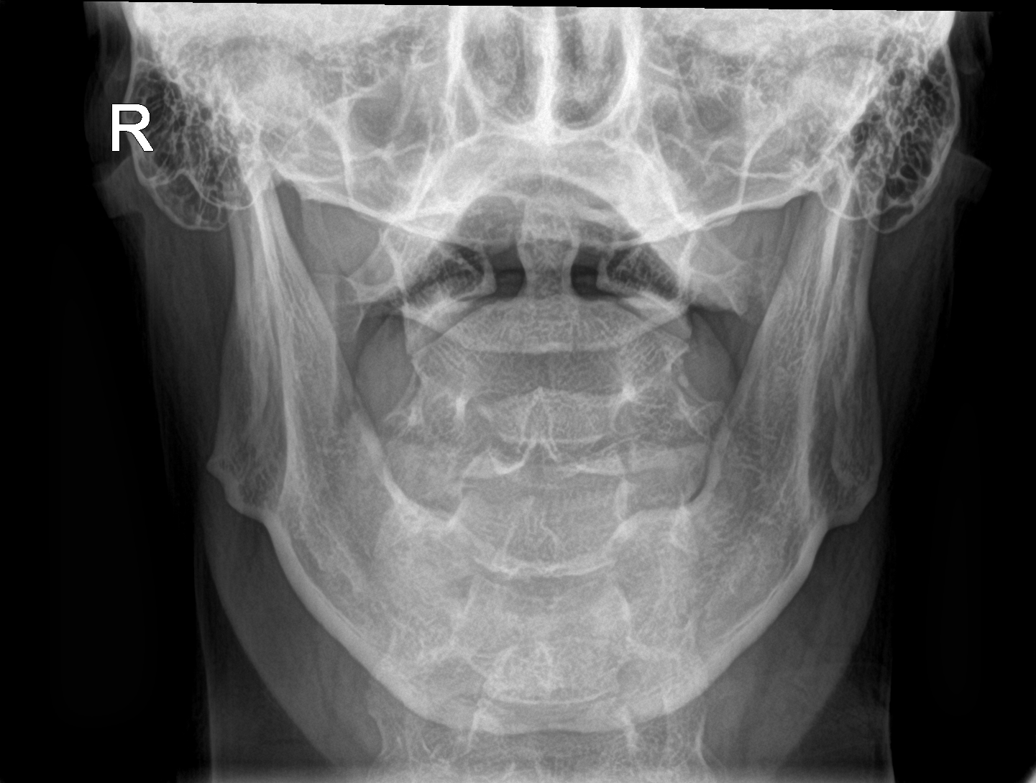

[c-spine swimmers trauma]
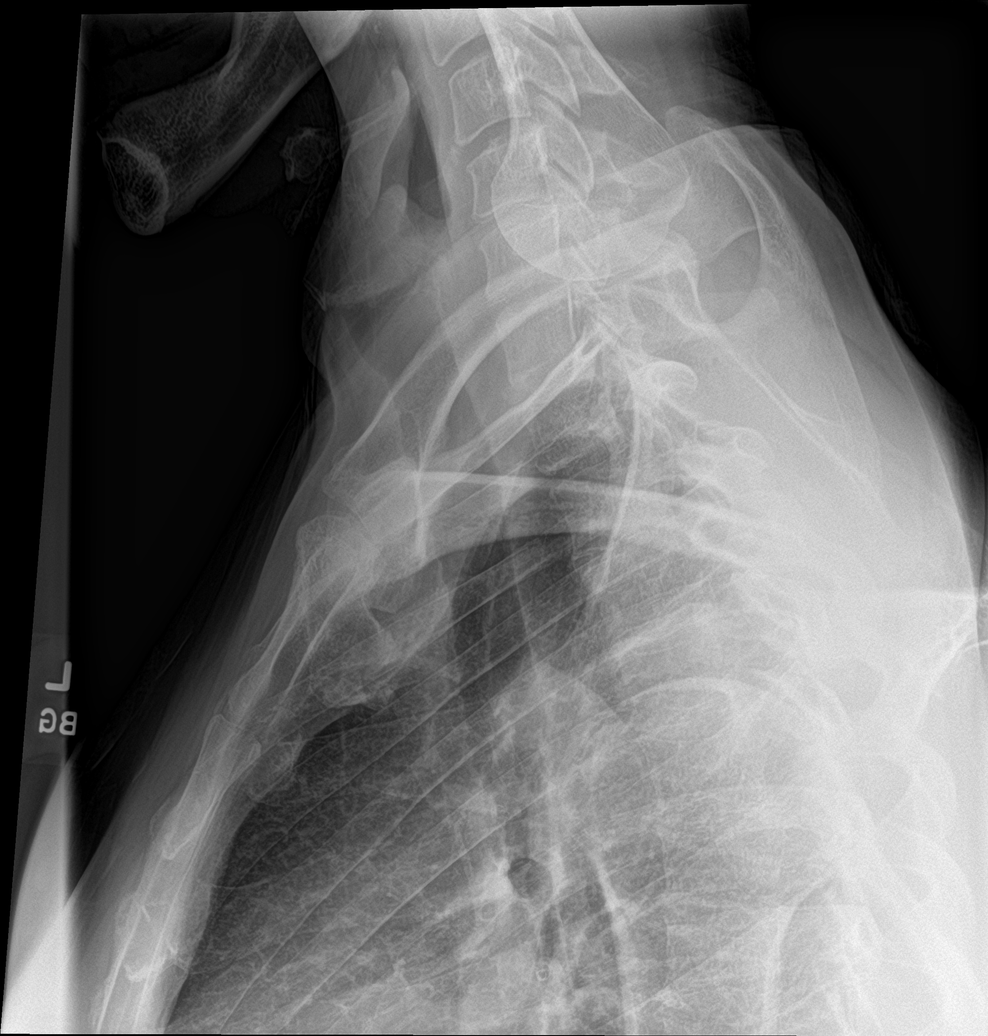

[6 of 6 positions shown; findings below may reference images not displayed]

FINDINGS: Chronic straightening of normal lordosis. No listhesis. Endplate
spurring from C4-C5 through C6-C7, slight C5-C6 disc space
narrowing. No significant facet changes. No definite bony neural
foraminal narrowing, although limited on the left due to
positioning. There is no evidence of fracture or focal bone
abnormality. No prevertebral soft tissue thickening.
IMPRESSION: Mild degenerative disc disease at C5-C6. Chronic straightening of
normal lordosis.

## 2022-08-26 ENCOUNTER — Telehealth: Payer: Self-pay

## 2022-08-26 NOTE — Patient Instructions (Signed)
Visit Information  Mr. Bary Leriche  - as a part of your Medicaid benefit, you are eligible for care management and care coordination services at no cost or copay. I was unable to reach you by phone today but would be happy to help you with your health related needs. Please feel free to call me @ [phone]).   A member of the Managed Medicaid care management team will reach out to you again over the next 7 days.   Mickel Fuchs, BSW, [place] Managed Medicaid Team  [phone]

## 2022-08-26 NOTE — Patient Outreach (Signed)
Care Coordination  08/26/2022  Geoffrey Carson 1986-09-18 [id number]    Medicaid Managed Care   Unsuccessful Outreach Note  08/26/2022 Name: Geoffrey Carson MRN: [id number] DOB: [birth year]  Referred by: Pcp, No Reason for referral : High Risk Managed Medicaid (MM social work telephone outreach )   An unsuccessful telephone outreach was attempted today. The patient was referred to the case management team for assistance with care management and care coordination.   Follow Up Plan: A HIPAA compliant phone message was left for the patient providing contact information and requesting a return call.   Mickel Fuchs, BSW, [place] Managed Medicaid Team  [phone]

## 2022-11-17 ENCOUNTER — Ambulatory Visit: Payer: Medicaid Other | Admitting: Cardiology

## 2022-12-06 ENCOUNTER — Emergency Department (HOSPITAL_COMMUNITY)
Admission: EM | Admit: 2022-12-06 | Discharge: 2022-12-06 | Disposition: A | Discharge status: Home / Self Care | Payer: Medicaid Other | Attending: Emergency Medicine | Admitting: Emergency Medicine

## 2022-12-06 ENCOUNTER — Other Ambulatory Visit: Payer: Self-pay

## 2022-12-06 ENCOUNTER — Encounter (HOSPITAL_COMMUNITY): Payer: Self-pay | Admitting: Emergency Medicine

## 2022-12-06 ENCOUNTER — Emergency Department (HOSPITAL_COMMUNITY): Payer: Medicaid Other

## 2022-12-06 DIAGNOSIS — S060X0A Concussion without loss of consciousness, initial encounter: Secondary | ICD-10-CM | POA: Diagnosis not present

## 2022-12-06 DIAGNOSIS — M542 Cervicalgia: Secondary | ICD-10-CM | POA: Diagnosis not present

## 2022-12-06 DIAGNOSIS — R519 Headache, unspecified: Secondary | ICD-10-CM | POA: Diagnosis present

## 2022-12-06 DIAGNOSIS — S0001XA Abrasion of scalp, initial encounter: Secondary | ICD-10-CM | POA: Diagnosis not present

## 2022-12-06 DIAGNOSIS — W228XXA Striking against or struck by other objects, initial encounter: Secondary | ICD-10-CM | POA: Diagnosis not present

## 2022-12-06 NOTE — ED Notes (Addendum)
Pt with dull pain to posterior head and frontal HA,  pt states he has noted some confusion and saying wrong words at times since a heavy metal shelf fell on his head a week ago.  Pt states his wife has noticed a change in personality.   Pt has noticed increase in fatigue and increase in depression, denies SI. Pt was able to state correct month and year, when asked his age pt initially stated 74 but then corrected himself with correct age.

## 2022-12-06 NOTE — ED Provider Notes (Signed)
[facility] Provider Note   CSN: [id number] Arrival date & time: 12/06/22  1143     History  Chief Complaint  Patient presents with   Head Injury    Geoffrey Carson is a 37 y.o. male presenting for evaluation of persistent headache, difficulty with concentration and increased light sensitivity for the past week after being struck on the top of his head by an metal shelf bracket.  He denies LOC at the time of the event but reports feeling dazed immediately after the event.  Since this occurred he has had symptoms per above, also has had some mild intermittent nausea, difficulty concentrating and per family "just has not been himself".  He has had no focal weakness, denies dizziness or overt confusion. He also reports neck pain left of midline.  He has taken ibuprofen with transient headache pain relief.  The history is provided by the patient.       Home Medications Prior to Admission medications   Medication Sig Start Date End Date Taking? Authorizing Provider  Buprenorphine HCl-Naloxone HCl (SUBOXONE) 8-2 MG FILM Placed under the tongue 3 times daily 12/29/21  Yes Triplett, Tammy, PA-C  ibuprofen (ADVIL,MOTRIN) 200 MG tablet Take 800 mg by mouth 2 (two) times daily as needed. For pain    Yes [provider]  clonazePAM (KLONOPIN) 0.5 MG tablet Take 1 tablet (0.5 mg total) by mouth 3 (three) times daily as needed for anxiety. Patient not taking: Reported on 03/15/2022 12/29/21   Kem Parkinson, PA-C  hydrOXYzine (ATARAX) 25 MG tablet Take 1 tablet (25 mg total) by mouth every 6 (six) hours as needed for anxiety. 03/15/22   Arnoldo Lenis, MD  meclizine (ANTIVERT) 25 MG tablet Take 1 tablet (25 mg total) by mouth 3 (three) times daily as needed for dizziness. Patient not taking: Reported on 12/29/2021 09/03/15   Kathrynn Ducking, MD  metoprolol tartrate (LOPRESSOR) 25 MG tablet Take 0.5 tablets (12.5 mg total) by mouth 2 (two)  times daily. 06/09/22   Arnoldo Lenis, MD  naloxone [facility]) nasal spray 4 mg/0.1 mL SMARTSIG:Spray(s) In Nostril 01/03/22   [provider]  naproxen (NAPROSYN) 500 MG tablet Take 500 mg by mouth 2 (two) times daily with a meal.    [provider]  ondansetron (ZOFRAN-ODT) 4 MG disintegrating tablet Take 1 tablet (4 mg total) by mouth every 6 (six) hours as needed for nausea or vomiting. 09/03/15   Kathrynn Ducking, MD  traMADol (ULTRAM) 50 MG tablet Take 1 tablet (50 mg total) by mouth every 6 (six) hours as needed. Patient not taking: Reported on 03/15/2022 12/29/21   Kem Parkinson, PA-C      Allergies    Azithromycin, Erythromycin, and Escitalopram    Review of Systems   Review of Systems  Constitutional:  Negative for fever.  HENT:  Negative for congestion and sore throat.   Eyes:  Positive for photophobia. Negative for visual disturbance.  Respiratory:  Negative for chest tightness and shortness of breath.   Cardiovascular:  Negative for chest pain.  Gastrointestinal:  Positive for nausea. Negative for abdominal pain and vomiting.  Genitourinary: Negative.   Musculoskeletal:  Positive for neck pain. Negative for arthralgias and joint swelling.  Skin: Negative.  Negative for rash and wound.  Neurological:  Positive for headaches. Negative for dizziness, weakness, light-headedness and numbness.  Psychiatric/Behavioral:  Positive for decreased concentration. Negative for confusion.     Physical Exam Updated Vital  Signs BP 124/87 (BP Location: Left Arm)   Pulse 72   Temp 97.8 F (36.6 C) (Oral)   Resp 18   Ht 6\' 2"  (1.88 m)   Wt 90.7 kg   SpO2 100%   BMI 25.68 kg/m  Physical Exam Vitals and nursing note reviewed.  Constitutional:      General: He is not in acute distress.    Appearance: He is well-developed.  HENT:     Head: Normocephalic.     Comments: Linear abrasion parietal scalp. Eyes:     Extraocular Movements: Extraocular movements intact.      Pupils: Pupils are equal, round, and reactive to light.  Cardiovascular:     Rate and Rhythm: Normal rate.     Heart sounds: Normal heart sounds.  Pulmonary:     Effort: Pulmonary effort is normal.  Abdominal:     Palpations: Abdomen is soft.     Tenderness: There is no abdominal tenderness.  Musculoskeletal:        General: Normal range of motion.     Cervical back: Normal range of motion and neck supple. Tenderness present. No bony tenderness.     Comments: Ttp left paracervical soft tissue.  No midline ttp.    Lymphadenopathy:     Cervical: No cervical adenopathy.  Skin:    General: Skin is warm and dry.     Findings: No rash.  Neurological:     General: No focal deficit present.     Mental Status: He is alert and oriented to person, place, and time.     GCS: GCS eye subscore is 4. GCS verbal subscore is 5. GCS motor subscore is 6.     Cranial Nerves: No cranial nerve deficit.     Sensory: No sensory deficit.     Motor: Motor function is intact.     Coordination: Coordination normal. Rapid alternating movements normal.     Gait: Gait is intact. Gait normal.     Deep Tendon Reflexes: Reflexes normal.     Comments: Normal heel-shin, normal rapid alternating movements. Cranial nerves III-XII intact.  No pronator drift.  Equal grip strength.   Psychiatric:        Speech: Speech normal.        Behavior: Behavior normal.        Thought Content: Thought content normal.     ED Results / Procedures / Treatments   Labs (all labs ordered are listed, but only abnormal results are displayed) Labs Reviewed - No data to display  EKG None  Radiology CT Cervical Spine Wo Contrast  Result Date: 12/06/2022 CLINICAL DATA:  Status post head trauma 1 week ago with subsequent dizziness. EXAM: CT CERVICAL SPINE WITHOUT CONTRAST TECHNIQUE: Multidetector CT imaging of the cervical spine was performed without intravenous contrast. Multiplanar CT image reconstructions were also generated.  RADIATION DOSE REDUCTION: This exam was performed according to the departmental dose-optimization program which includes automated exposure control, adjustment of the mA and/or kV according to patient size and/or use of iterative reconstruction technique. COMPARISON:  None Available. FINDINGS: Alignment: There is mild reversal of the normal cervical spine lordosis. Skull base and vertebrae: No acute fracture. No primary bone lesion or focal pathologic process. Soft tissues and spinal canal: No prevertebral fluid or swelling. No visible canal hematoma. Mild anterior longitudinal ligament calcification is seen at the level of C4-C5. Disc levels: Normal multilevel endplates are seen throughout the cervical spine with mild posterior bony spurring noted at the level of  C5-C6. Normal multilevel intervertebral disc spaces are seen. Normal, bilateral multilevel facet joints are noted. Upper chest: Negative. Other: There is mild bilateral cervical chain lymphadenopathy. IMPRESSION: 1. No acute fracture or subluxation in the cervical spine. 2. Mild bilateral cervical chain lymphadenopathy, likely reactive. Electronically Signed   By: Virgina Norfolk M.D.   On: 12/06/2022 15:07   CT Head Wo Contrast  Result Date: 12/06/2022 CLINICAL DATA:  Status post head trauma 1 week ago with subsequent dizziness. EXAM: CT HEAD WITHOUT CONTRAST TECHNIQUE: Contiguous axial images were obtained from the base of the skull through the vertex without intravenous contrast. RADIATION DOSE REDUCTION: This exam was performed according to the departmental dose-optimization program which includes automated exposure control, adjustment of the mA and/or kV according to patient size and/or use of iterative reconstruction technique. COMPARISON:  January 17, 2005 FINDINGS: Brain: No evidence of acute infarction, hemorrhage, hydrocephalus, extra-axial collection or mass lesion/mass effect. Vascular: No hyperdense vessel or unexpected calcification.  Skull: Normal. Negative for fracture or focal lesion. Sinuses/Orbits: No acute finding. Other: None. IMPRESSION: No acute intracranial pathology. Electronically Signed   By: Virgina Norfolk M.D.   On: 12/06/2022 15:04    Procedures Procedures    Medications Ordered in ED Medications - No data to display  ED Course/ Medical Decision Making/ A&P                             Medical Decision Making Patient presenting with symptoms a week out from a head injury suggesting mild concussion without history of LOC.  His CT imaging today is negative for any acute intracranial injury, C-spine CT is also negative for acute injury.  He is given instructions on care of concussion, is also given referral to the concussion clinic in [facility] for follow-up care if his symptoms persist or are not significantly improving through the course of this week.  Amount and/or Complexity of Data Reviewed Radiology: ordered.    Details: Ct imaging reviewed and discussed with pt.  No acute findings.             Final Clinical Impression(s) / ED Diagnoses Final diagnoses:  Concussion without loss of consciousness, initial encounter    Rx / DC Orders ED Discharge Orders     None         Landis Martins 12/07/22 0910    Godfrey Pick, MD 12/10/22 [phone]

## 2022-12-06 NOTE — Discharge Instructions (Signed)
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  Refer to the instructions below for treatment of this which essentially involves resting your brain, avoiding activities that require prolonged concentration, limiting screen time meaning phones and computers and TVs.  Rest.  Plan to get rechecked in a week if you are not improving with this treatment plan.  You can continue taking your ibuprofen for headache pain relief.  You can also alternate this with Tylenol.

## 2022-12-06 NOTE — ED Notes (Signed)
Patient transported to CT 

## 2022-12-06 NOTE — ED Triage Notes (Signed)
Patient states that a week ago a metal shelf fell on his head knocking him to the ground. No LOC, no thinners. States he has had a headache / nausea and felt fuzzy headed since the event.

## 2022-12-08 ENCOUNTER — Telehealth: Payer: Self-pay | Admitting: *Deleted

## 2022-12-08 NOTE — Patient Outreach (Signed)
  Care Coordination [facility] Note Transition Care Management Follow-up Telephone Call Date of discharge and from where: 12/06/22 from [facility] ED How have you been since you were released from the hospital? "Still having headaches" Any questions or concerns? No  Items Reviewed: Did the pt receive and understand the discharge instructions provided? Yes  Medications obtained and verified?  No new prescriptions Other? Yes RNCM encouraged patient to establish care with a PCP, offered to assist. Patient planning to schedule at [facility] and will make the call himself Any new allergies since your discharge? No  Dietary orders reviewed? No Do you have support at home? Yes   Home Care and Equipment/Supplies: Were home health services ordered? no If so, what is the name of the agency? N/A  Has the agency set up a time to come to the patient's home? not applicable Were any new equipment or medical supplies ordered?  No What is the name of the medical supply agency? NA Were you able to get the supplies/equipment? not applicable Do you have any questions related to the use of the equipment or supplies? No  Functional Questionnaire: (I = Independent and D = Dependent) ADLs: I  Bathing/Dressing- I  Meal Prep- I  Eating- I  Maintaining continence- I  Transferring/Ambulation- I  Managing Meds- I  Follow up appointments reviewed:  PCP Hospital f/u appt confirmed?  N/A, ED visit  Patient will call to establish PCP. [facility] f/u appt confirmed?  N/A, ED visit  Patient has the information to the Concussion clinic and will call if he continues to have symptoms. Are transportation arrangements needed? No  If their condition worsens, is the pt aware to call PCP or go to the Emergency Dept.? Yes Was the patient provided with contact information for the PCP's office or ED? No Was to pt encouraged to call back with questions or concerns? Yes  SDOH assessments and interventions  completed:   Yes   Lurena Joiner RN, [place] RN Care Coordinator

## 2023-01-05 DIAGNOSIS — F1120 Opioid dependence, uncomplicated: Secondary | ICD-10-CM | POA: Diagnosis not present

## 2023-01-05 DIAGNOSIS — Z79891 Long term (current) use of opiate analgesic: Secondary | ICD-10-CM | POA: Diagnosis not present

## 2023-01-15 DIAGNOSIS — F1120 Opioid dependence, uncomplicated: Secondary | ICD-10-CM | POA: Diagnosis not present

## 2023-01-15 DIAGNOSIS — Z79891 Long term (current) use of opiate analgesic: Secondary | ICD-10-CM | POA: Diagnosis not present

## 2023-01-16 DIAGNOSIS — F1120 Opioid dependence, uncomplicated: Secondary | ICD-10-CM | POA: Diagnosis not present

## 2023-01-30 DIAGNOSIS — F1120 Opioid dependence, uncomplicated: Secondary | ICD-10-CM | POA: Diagnosis not present

## 2023-02-27 ENCOUNTER — Ambulatory Visit: Payer: Medicaid Other | Admitting: Cardiology

## 2023-03-08 DIAGNOSIS — F1120 Opioid dependence, uncomplicated: Secondary | ICD-10-CM | POA: Diagnosis not present

## 2023-03-10 ENCOUNTER — Ambulatory Visit: Payer: Medicaid Other | Admitting: Student

## 2023-04-05 DIAGNOSIS — Z79891 Long term (current) use of opiate analgesic: Secondary | ICD-10-CM | POA: Diagnosis not present

## 2023-04-05 DIAGNOSIS — F1120 Opioid dependence, uncomplicated: Secondary | ICD-10-CM | POA: Diagnosis not present

## 2023-04-13 DIAGNOSIS — F17210 Nicotine dependence, cigarettes, uncomplicated: Secondary | ICD-10-CM | POA: Diagnosis not present

## 2023-04-13 DIAGNOSIS — F1120 Opioid dependence, uncomplicated: Secondary | ICD-10-CM | POA: Diagnosis not present

## 2023-04-13 DIAGNOSIS — Z7141 Alcohol abuse counseling and surveillance of alcoholic: Secondary | ICD-10-CM | POA: Diagnosis not present

## 2023-04-13 DIAGNOSIS — Z029 Encounter for administrative examinations, unspecified: Secondary | ICD-10-CM | POA: Diagnosis not present

## 2023-04-18 ENCOUNTER — Other Ambulatory Visit: Payer: Self-pay | Admitting: Cardiology

## 2023-05-26 ENCOUNTER — Ambulatory Visit: Payer: Medicaid Other | Admitting: Student

## 2023-06-01 DIAGNOSIS — Z79891 Long term (current) use of opiate analgesic: Secondary | ICD-10-CM | POA: Diagnosis not present

## 2023-06-01 DIAGNOSIS — F1120 Opioid dependence, uncomplicated: Secondary | ICD-10-CM | POA: Diagnosis not present

## 2023-07-06 DIAGNOSIS — F1120 Opioid dependence, uncomplicated: Secondary | ICD-10-CM | POA: Diagnosis not present

## 2023-07-07 DIAGNOSIS — F1120 Opioid dependence, uncomplicated: Secondary | ICD-10-CM | POA: Diagnosis not present

## 2023-08-03 DIAGNOSIS — F1120 Opioid dependence, uncomplicated: Secondary | ICD-10-CM | POA: Diagnosis not present

## 2023-08-04 ENCOUNTER — Ambulatory Visit: Payer: BC Managed Care – PPO | Attending: Cardiology | Admitting: Student

## 2023-08-04 ENCOUNTER — Encounter: Payer: Self-pay | Admitting: Student

## 2023-08-04 VITALS — BP 106/80 | HR 80 | Ht 74.0 in | Wt 207.0 lb

## 2023-08-04 DIAGNOSIS — R002 Palpitations: Secondary | ICD-10-CM

## 2023-08-04 MED ORDER — METOPROLOL TARTRATE 25 MG PO TABS: ORAL_TABLET | ORAL | 3 refills | Status: AC

## 2023-08-04 NOTE — Patient Instructions (Addendum)
Medication Instructions:  Take Lopressor 12.5 mg twice a day,may take an extra 1/2 tablet for palpitations   Labwork: None today  Testing/Procedures: None today  Follow-Up: 1 year  Any Other Special Instructions Will Be Listed Below (If Applicable).  If you need a refill on your cardiac medications before your next appointment, please call your pharmacy.

## 2023-09-06 DIAGNOSIS — G894 Chronic pain syndrome: Secondary | ICD-10-CM | POA: Diagnosis not present

## 2023-09-06 DIAGNOSIS — Z79891 Long term (current) use of opiate analgesic: Secondary | ICD-10-CM | POA: Diagnosis not present

## 2023-09-06 DIAGNOSIS — F17210 Nicotine dependence, cigarettes, uncomplicated: Secondary | ICD-10-CM | POA: Diagnosis not present

## 2024-03-11 ENCOUNTER — Other Ambulatory Visit: Payer: Self-pay

## 2024-03-11 ENCOUNTER — Emergency Department (HOSPITAL_BASED_OUTPATIENT_CLINIC_OR_DEPARTMENT_OTHER): Admitting: Radiology

## 2024-03-11 ENCOUNTER — Emergency Department (HOSPITAL_BASED_OUTPATIENT_CLINIC_OR_DEPARTMENT_OTHER)

## 2024-03-11 ENCOUNTER — Encounter (HOSPITAL_BASED_OUTPATIENT_CLINIC_OR_DEPARTMENT_OTHER): Payer: Self-pay | Admitting: Emergency Medicine

## 2024-03-11 ENCOUNTER — Emergency Department (HOSPITAL_BASED_OUTPATIENT_CLINIC_OR_DEPARTMENT_OTHER)
Admission: EM | Admit: 2024-03-11 | Discharge: 2024-03-11 | Disposition: A | Discharge status: Home / Self Care | Attending: Emergency Medicine | Admitting: Emergency Medicine

## 2024-03-11 DIAGNOSIS — L03116 Cellulitis of left lower limb: Secondary | ICD-10-CM | POA: Insufficient documentation

## 2024-03-11 DIAGNOSIS — M79605 Pain in left leg: Secondary | ICD-10-CM | POA: Diagnosis present

## 2024-03-11 MED ORDER — IBUPROFEN 800 MG PO TABS
800.0000 mg | ORAL_TABLET | Freq: Once | ORAL | Status: AC
  Administered 2024-03-11: 800 mg via ORAL
  Filled 2024-03-11: qty 1

## 2024-03-11 MED ORDER — OXYCODONE-ACETAMINOPHEN 5-325 MG PO TABS: 1.0000 | ORAL_TABLET | Freq: Once | ORAL | Status: DC

## 2024-03-11 MED ORDER — DOXYCYCLINE HYCLATE 100 MG PO TABS
100.0000 mg | ORAL_TABLET | Freq: Once | ORAL | Status: AC
  Administered 2024-03-11: 100 mg via ORAL
  Filled 2024-03-11: qty 1

## 2024-03-11 MED ORDER — KETOROLAC TROMETHAMINE 60 MG/2ML IM SOLN
30.0000 mg | Freq: Once | INTRAMUSCULAR | Status: DC
Start: 2024-03-11 — End: 2024-03-11
  Filled 2024-03-11: qty 2

## 2024-03-11 MED ORDER — DOXYCYCLINE HYCLATE 100 MG PO CAPS
100.0000 mg | ORAL_CAPSULE | Freq: Two times a day (BID) | ORAL | 0 refills | Status: DC
Start: 2024-03-11 — End: 2024-03-14

## 2024-03-11 NOTE — ED Provider Notes (Signed)
 [place] EMERGENCY DEPARTMENT AT [facility] Provider Note   CSN: [id number] Arrival date & time: 03/11/24  1191     History  Chief Complaint  Patient presents with   Leg Pain    Geoffrey Carson is a 38 y.o. male, history of chronic pain, who presents to the ED secondary to pain of his left leg, it has been going on for the last day.  He states he woke up, noticed his left leg is swollen, and painful to walk on.  Denies any trauma, but states there is a rash on it, and put some cream on it, without relief.  He states the pain, feels very throbbing, and warm to the touch.  Denies any recent injuries, or wounds.  States the pain started around where his socks were, and spread down, and into his calf as well.  Notes he is concerned, as he has a history of blood clots in his family.     Home Medications Prior to Admission medications   Medication Sig Start Date End Date Taking? Authorizing Provider  doxycycline  (VIBRAMYCIN ) 100 MG capsule Take 1 capsule (100 mg total) by mouth 2 (two) times daily. 03/11/24  Yes Jenya Putz L, PA  Buprenorphine  HCl-Naloxone  HCl 12-3 MG FILM Place under the tongue 2 (two) times daily. 1 Strip(s) Sublingual Twice Daily 02/08/23   [provider]  clonazePAM  (KLONOPIN ) 0.5 MG tablet Take 1 tablet (0.5 mg total) by mouth 3 (three) times daily as needed for anxiety. Patient not taking: Reported on 03/15/2022 12/29/21   Triplett, Tammy, PA-C  EQ NICOTINE 14 MG/24HR patch Place 14 mg onto the skin daily. 08/03/23   [provider]  hydrOXYzine  (ATARAX ) 25 MG tablet Take 1 tablet (25 mg total) by mouth every 6 (six) hours as needed for anxiety. Patient not taking: Reported on 12/08/2022 03/15/22   Laurann Pollock, MD  ibuprofen  (ADVIL ,MOTRIN ) 200 MG tablet Take 800 mg by mouth 2 (two) times daily as needed. For pain     [provider]  meclizine  (ANTIVERT ) 25 MG tablet Take 1 tablet (25 mg total) by mouth 3 (three) times daily as  needed for dizziness. Patient not taking: Reported on 12/29/2021 09/03/15   Brian Campanile, MD  metoprolol  tartrate (LOPRESSOR ) 25 MG tablet Take 12.5 mg (1/2 tablet) twice a day.may take an extra 12.5 mg daily for palpitations 08/04/23   Finis Hugger, [place] M, PA-C  naloxone  (NARCAN ) nasal spray 4 mg/0.1 mL SMARTSIG:Spray(s) In Nostril Patient not taking: Reported on 12/08/2022 01/03/22   [provider]  naproxen (NAPROSYN) 500 MG tablet Take 500 mg by mouth 2 (two) times daily with a meal.    [provider]  ondansetron  (ZOFRAN -ODT) 4 MG disintegrating tablet Take 1 tablet (4 mg total) by mouth every 6 (six) hours as needed for nausea or vomiting. 09/03/15   Brian Campanile, MD  traMADol  (ULTRAM ) 50 MG tablet Take 1 tablet (50 mg total) by mouth every 6 (six) hours as needed. Patient not taking: Reported on 03/15/2022 12/29/21   Catherne Clubs, PA-C      Allergies    Azithromycin, Erythromycin, and Escitalopram    Review of Systems   Review of Systems  Constitutional:  Negative for fever.  Skin:  Positive for rash.    Physical Exam Updated Vital Signs BP 124/70   Pulse 68   Temp 98.6 F (37 C) (Oral)   Resp 18   SpO2 100%  Physical Exam Vitals and nursing note  reviewed.  Constitutional:      General: He is not in acute distress.    Appearance: He is well-developed.  HENT:     Head: Normocephalic and atraumatic.  Eyes:     Conjunctiva/sclera: Conjunctivae normal.  Cardiovascular:     Rate and Rhythm: Normal rate and regular rhythm.     Heart sounds: No murmur heard. Pulmonary:     Effort: Pulmonary effort is normal. No respiratory distress.     Breath sounds: Normal breath sounds.  Abdominal:     Palpations: Abdomen is soft.     Tenderness: There is no abdominal tenderness.  Musculoskeletal:        General: Swelling present.     Cervical back: Neck supple.     Comments: Mild diffuse swelling, the left lower extremity, with overlying erythema and some  areas.  No skin breakdown. Compartments are soft.  Positive dorsalis pedis pulse.  Skin:    General: Skin is warm and dry.     Capillary Refill: Capillary refill takes less than 2 seconds.  Neurological:     Mental Status: He is alert.  Psychiatric:        Mood and Affect: Mood normal.     ED Results / Procedures / Treatments   Labs (all labs ordered are listed, but only abnormal results are displayed) Labs Reviewed - No data to display  EKG None  Radiology DG Ankle Complete Left Result Date: 03/11/2024 CLINICAL DATA:  Swelling and pain. EXAM: LEFT ANKLE COMPLETE - 3+ VIEW COMPARISON:  None Available. FINDINGS: There is no evidence of fracture, dislocation, or joint effusion. There is no evidence of arthropathy. Plantar calcaneal spur is present. Soft tissues are unremarkable. IMPRESSION: Negative. Electronically Signed   By: Tyron Gallon M.D.   On: 03/11/2024 22:28   DG Tibia/Fibula Left Result Date: 03/11/2024 CLINICAL DATA:  Swelling and pain. EXAM: LEFT TIBIA AND FIBULA - 2 VIEW COMPARISON:  None Available. FINDINGS: There is no evidence of fracture or other focal bone lesions. Soft tissues are unremarkable. IMPRESSION: Negative. Electronically Signed   By: Tyron Gallon M.D.   On: 03/11/2024 22:27   US  Venous Img Lower Unilateral Left Result Date: 03/11/2024 CLINICAL DATA:  swelling in left calf / r/o DVt EXAM: LEFT LOWER EXTREMITY VENOUS DOPPLER ULTRASOUND TECHNIQUE: Gray-scale sonography with graded compression, as well as color Doppler and duplex ultrasound were performed to evaluate the lower extremity deep venous systems from the level of the common femoral vein and including the common femoral, femoral, profunda femoral, popliteal and calf veins including the posterior tibial, peroneal and gastrocnemius veins when visible. The superficial great saphenous vein was also interrogated. Spectral Doppler was utilized to evaluate flow at rest and with distal augmentation maneuvers in the  common femoral, femoral and popliteal veins. COMPARISON:  None Available. FINDINGS: Contralateral Common Femoral Vein: Respiratory phasicity is normal and symmetric with the symptomatic side. No evidence of thrombus. Normal compressibility. Common Femoral Vein: No evidence of thrombus. Normal compressibility, respiratory phasicity and response to augmentation. Saphenofemoral Junction: No evidence of thrombus. Normal compressibility and flow on color Doppler imaging. Profunda Femoral Vein: No evidence of thrombus. Normal compressibility and flow on color Doppler imaging. Femoral Vein: No evidence of thrombus. Normal compressibility, respiratory phasicity and response to augmentation. Popliteal Vein: No evidence of thrombus. Normal compressibility, respiratory phasicity and response to augmentation. Calf Veins: No evidence of thrombus. Normal compressibility and flow on color Doppler imaging. Superficial Great Saphenous Vein: No evidence of thrombus. Normal compressibility.  Other Findings:  None. IMPRESSION: Negative for deep venous thrombosis in the left leg. Electronically Signed   By: Rance Burrows M.D.   On: 03/11/2024 20:17    Procedures Procedures    Medications Ordered in ED Medications  doxycycline  (VIBRA -TABS) tablet 100 mg (100 mg Oral Given 03/11/24 2219)  ibuprofen  (ADVIL ) tablet 800 mg (800 mg Oral Given 03/11/24 2222)    ED Course/ Medical Decision Making/ A&P                                 Medical Decision Making Patient is a 38 year old male, here for left leg swelling, this been going on for the last day, has a good pulse, on exam, has some redness, and pain.  He has great a pulse, has mild erythema, and swelling, on my exam we will obtain an ultrasound as the patient states he is extremely worried, because his family has a history of DVTs.  Will also obtain x-rays, to rule out any kind of possible stress fracture, as most of the pain is around the ankle.  He does not use any IV  drugs, and he has not had any fevers  Amount and/or Complexity of Data Reviewed Radiology: ordered.    Details: X-rays and ultrasound unremarkable Discussion of management or test interpretation with external provider(s): Discussed with patient, x-rays and ultrasound are unremarkable, this may represent cellulitis versus a bug bite, which caused this, we will start on doxycycline , and have follow-up with primary care doctor.  Discussed return precautions and discharged home.  Great pulse, and in no pallor, or necrosis noted.  Risk Prescription drug management.   Final Clinical Impression(s) / ED Diagnoses Final diagnoses:  Cellulitis of left lower extremity    Rx / DC Orders ED Discharge Orders          Ordered    doxycycline  (VIBRAMYCIN ) 100 MG capsule  2 times daily        03/11/24 2258              Tanicia Wolaver, Dwaine Gip, [place] 03/11/24 2301    Almond Army, MD 03/15/24 1344

## 2024-03-11 NOTE — Discharge Instructions (Addendum)
 Your x-rays were reassuring, and your ultrasound was negative.  Is possible that you have cellulitis, please take ibuprofen Tylenol  for the pain control, and I prescribed you some time off work.  Please return to the ER if you have worsening rash, warmth, or your foot turns white, or black.

## 2024-03-11 NOTE — ED Notes (Signed)
Reviewed discharge instructions, medications, and home care with pt. Pt verbalized understanding and had no further questions. Pt exited ED without complications.

## 2024-03-11 NOTE — ED Triage Notes (Signed)
 Left leg pain, redness, tightness and pain. Notice last night Family history of blood clots

## 2024-03-14 ENCOUNTER — Telehealth (HOSPITAL_BASED_OUTPATIENT_CLINIC_OR_DEPARTMENT_OTHER): Payer: Self-pay | Admitting: Emergency Medicine

## 2024-03-14 MED ORDER — SULFAMETHOXAZOLE-TRIMETHOPRIM 800-160 MG PO TABS: 1.0000 | ORAL_TABLET | Freq: Two times a day (BID) | ORAL | 0 refills | Status: AC

## 2024-03-14 NOTE — Telephone Encounter (Signed)
 Patient was prescribed doxycycline for a possible cellulitis of his lower leg on May 5.  He called to say that he thought he was having allergic reaction because he has had some itching related to it.  Request medication change.  This was DC'd and will start Bactrim.  He denies any facial swelling or airway involvement.  Advised him to come back to emergency room if he has any worsening symptoms or follow-up with his primary care doctor if his infection is not improving.
